# Patient Record
Sex: Female | Born: 1950
Health system: Southern US, Community
[De-identification: ages and names within clinical notes are randomized; demographics above are authoritative.]

## PROBLEM LIST (undated history)

## (undated) DIAGNOSIS — M797 Fibromyalgia: Secondary | ICD-10-CM

## (undated) DIAGNOSIS — M3320 Polymyositis, organ involvement unspecified: Secondary | ICD-10-CM

## (undated) DIAGNOSIS — E1165 Type 2 diabetes mellitus with hyperglycemia: Secondary | ICD-10-CM

## (undated) HISTORY — PX: SPINE SURGERY: SHX786

## (undated) HISTORY — DX: Type 2 diabetes mellitus with hyperglycemia: E11.65

## (undated) HISTORY — PX: KNEE ARTHROSCOPY: SUR90

---

## 1997-11-26 ENCOUNTER — Ambulatory Visit (HOSPITAL_COMMUNITY): Admission: RE | Admit: 1997-11-26 | Discharge: 1997-11-26 | Payer: Self-pay | Admitting: Obstetrics and Gynecology

## 1997-12-18 ENCOUNTER — Ambulatory Visit (HOSPITAL_COMMUNITY): Admission: RE | Admit: 1997-12-18 | Discharge: 1997-12-18 | Payer: Self-pay | Admitting: Neurological Surgery

## 1999-03-10 ENCOUNTER — Encounter: Payer: Self-pay | Admitting: Obstetrics and Gynecology

## 1999-03-10 ENCOUNTER — Ambulatory Visit (HOSPITAL_COMMUNITY): Admission: RE | Admit: 1999-03-10 | Discharge: 1999-03-10 | Payer: Self-pay | Admitting: Obstetrics and Gynecology

## 2000-04-10 ENCOUNTER — Ambulatory Visit (HOSPITAL_COMMUNITY): Admission: RE | Admit: 2000-04-10 | Discharge: 2000-04-10 | Payer: Self-pay | Admitting: Neurological Surgery

## 2000-04-10 ENCOUNTER — Encounter: Payer: Self-pay | Admitting: Neurological Surgery

## 2000-04-28 ENCOUNTER — Encounter: Payer: Self-pay | Admitting: Neurological Surgery

## 2000-05-02 ENCOUNTER — Inpatient Hospital Stay (HOSPITAL_COMMUNITY): Admission: RE | Admit: 2000-05-02 | Discharge: 2000-05-04 | Payer: Self-pay | Admitting: Neurological Surgery

## 2000-05-02 ENCOUNTER — Encounter: Payer: Self-pay | Admitting: Neurological Surgery

## 2000-05-27 ENCOUNTER — Encounter: Payer: Self-pay | Admitting: Neurological Surgery

## 2000-05-27 ENCOUNTER — Encounter: Admission: RE | Admit: 2000-05-27 | Discharge: 2000-05-27 | Payer: Self-pay | Admitting: Neurological Surgery

## 2000-07-20 ENCOUNTER — Encounter: Admission: RE | Admit: 2000-07-20 | Discharge: 2000-07-20 | Payer: Self-pay | Admitting: Neurological Surgery

## 2000-07-20 ENCOUNTER — Encounter: Payer: Self-pay | Admitting: Neurological Surgery

## 2000-09-13 ENCOUNTER — Ambulatory Visit (HOSPITAL_COMMUNITY): Admission: RE | Admit: 2000-09-13 | Discharge: 2000-09-13 | Payer: Self-pay | Admitting: Obstetrics and Gynecology

## 2000-09-13 ENCOUNTER — Encounter: Payer: Self-pay | Admitting: Obstetrics and Gynecology

## 2001-10-24 ENCOUNTER — Encounter: Payer: Self-pay | Admitting: Internal Medicine

## 2001-10-24 ENCOUNTER — Ambulatory Visit (HOSPITAL_COMMUNITY): Admission: RE | Admit: 2001-10-24 | Discharge: 2001-10-24 | Payer: Self-pay | Admitting: Internal Medicine

## 2003-08-21 ENCOUNTER — Ambulatory Visit (HOSPITAL_COMMUNITY): Admission: RE | Admit: 2003-08-21 | Discharge: 2003-08-21 | Payer: Self-pay | Admitting: Internal Medicine

## 2005-02-04 ENCOUNTER — Ambulatory Visit (HOSPITAL_COMMUNITY): Admission: RE | Admit: 2005-02-04 | Discharge: 2005-02-04 | Payer: Self-pay | Admitting: General Surgery

## 2005-02-04 ENCOUNTER — Encounter (INDEPENDENT_AMBULATORY_CARE_PROVIDER_SITE_OTHER): Payer: Self-pay | Admitting: *Deleted

## 2005-02-19 ENCOUNTER — Ambulatory Visit (HOSPITAL_COMMUNITY): Admission: RE | Admit: 2005-02-19 | Discharge: 2005-02-19 | Payer: Self-pay | Admitting: Internal Medicine

## 2007-03-28 ENCOUNTER — Ambulatory Visit (HOSPITAL_COMMUNITY): Admission: RE | Admit: 2007-03-28 | Discharge: 2007-03-28 | Payer: Self-pay | Admitting: Obstetrics and Gynecology

## 2008-04-16 ENCOUNTER — Ambulatory Visit (HOSPITAL_COMMUNITY): Admission: RE | Admit: 2008-04-16 | Discharge: 2008-04-16 | Payer: Self-pay | Admitting: Internal Medicine

## 2011-01-08 NOTE — Discharge Summary (Signed)
NAMETILIA, FASO                 ACCOUNT NO.:  [id number]   MEDICAL RECORD NO.:  [id number]          PATIENT TYPE:  OIB   LOCATION:  2899                         FACILITY:  [facility]   PHYSICIAN:  Gita Kudo, M.D. DATE OF BIRTH:  1951-08-01   DATE OF ADMISSION:  02/04/2005  DATE OF DISCHARGE:                                 DISCHARGE SUMMARY   PROCEDURE:  Right deltoid muscle biopsy.   SURGEON:  Gita Kudo, M.D.   ANESTHESIA:  General mask-1% Xylocaine/0.5% Marcaine.   PREOPERATIVE DIAGNOSES:  Possible myositis.   POSTOPERATIVE DIAGNOSES:  Possible myositis.   CLINICAL COURSE:  A 60 year old female with multiple musculoskeletal  symptoms and workup that reveals possible myositis. Muscle biopsy was  needed.   FINDINGS:  The deltoid muscle looked normal.   DESCRIPTION OF PROCEDURE:  Under satisfactory general anesthesia, the  patient was positioned, prepped and draped in a standard fashion. A vertical  incision made over the upper right deltoid and carried down to the fascia.  Cautery was not used to avoid any artifact. No Marcaine or lidocaine was  infiltrated at this time. The fascia opened and three strips of muscle  obtained, secured with suture to the tongue blade and sent in moistened  gauze per the new protocol. Pathology was notified and they called [place] that  they received the specimen. It will be sent to [facility].   The wound was then infiltrated with Marcaine, lavaged with saline and closed  in layers with 3-0 Vicryl and 4-0 nylon. Intermittent Steri-Strips applied.  There were no complications. A sterile absorbent dressing was applied. Went  to the recovery room from the operating room in good condition.       MRL/MEDQ  D:  02/04/2005  T:  02/04/2005  Job:  [id number]   cc:   Candyce Churn, M.D.  [address] [place]  [place] [address]  Fax: [phone]   Demetria Pore. Coral Spikes, M.D.  [address]  [address]  [place]  [place] [address]  Fax: [phone]   C. Lesia Sago, M.D.  [address]  [address]  [place]  [place] [address]  Fax: [phone]

## 2011-01-08 NOTE — Op Note (Signed)
[place]  Patient:    Dawn Sanchez, Dawn Sanchez                          MRN: [id number] Proc. Date: 05/02/00 Adm. Date:  [id number] Attending:  Jonne Ply                           Operative Report  PREOPERATIVE DIAGNOSIS:  Cervical spondylosis, C3-4, C4-5 with cervical radiculopathy C5.  POSTOPERATIVE DIAGNOSIS:  Cervical spondylosis, C3-4, C4-5 with cervical radiculopathy C5.  OPERATION PERFORMED:  Anterior cervical diskectomy and arthrodesis C3-4, C4-5.  SURGEON:  Stefani Dama, M.D.  ASSISTANT:  Payton Doughty, M.D.  ANESTHESIA:  General endotracheal.  INDICATIONS FOR PROCEDURE:  The patient is a 60 year old individual who has had significant neck, shoulder and arm pain.  She has discomfort in the distribution of C5 nerve roots, mild deltoid weakness.  She was advised regarding surgical decompression and stabilization of the procedure.  DESCRIPTION OF PROCEDURE:  The patient was brought to the operating room and placed on the table in supine position.  After smooth induction of general endotracheal anesthesia, she was placed in 5 pounds of halter traction.  The neck was shaved, prepped with DuraPrep and draped in sterile fashion.  A transverse incision was made in the upper portion of the neck on the left side and carried down to the platysma.  The plane between the sternocleidomastoid and the strap muscles was dissected bluntly until the prevertebral space was reached.  The first identifiable disk space was noted to be that of C3-4. Self-retaining retractors of the Caspar type were placed into the wound behind the longus colli muscle which was stripped on either side of the midline.  The dissection was then continued opening up the disk space and removing large ventral osteophyte from C3-4. Total diskectomy was performed using a combination of curets and rongeurs with loop magnification and headlight.  The dissection was then carried out  to the right side where a 1 and 2 mm Kerrison punch along with the Midas Rex drill and A-2 bur was used to remove uncinate process hypertrophy and a large lateral bone spur.  Same procedure was carried out on the left side.  In the end the common dural tube and the take off of the C4 nerve roots were identified and cleared bilaterally.  7 mm round fibular graft was placed into the interspace after being packed with the patients autologous bone. Attention was turned to C4-5 where a similar procedure was carried out.  The disk space was opened and the nerve roots were well decompressed on either side at C4-5.  With this being accomplished, a 7 mm round fibular graft was also placed.  Hemostasis from the epidural veins was checked with bipolar cautery and small pledgets of Gelfoam soaked in thrombin which were later removed.  Traction was then removed after placing both grafts and the patients neck was placed in slight flexion.  A 37 mm Synthes plate was then affixed with six locking 4 x 14 mm screws, which were self drilling and self tapping.  Locking screws were than placed and a localizing radiograph identified good position of the hardware and end plates. The area was checked for hemostasis in the soft tissues and the platysma was closed with 3-0 Vicryl in interrupted fashion.  3-0 Vicryl was used subcuticularly.  A clear plastic dressing was placed on  the skin.  The patient tolerated the procedure well and was returned to the recovery room in stable condition. DD:  05/02/00 TD:  05/03/00 Job: [id number]

## 2015-01-09 ENCOUNTER — Non-Acute Institutional Stay (SKILLED_NURSING_FACILITY): Payer: Medicare Other | Admitting: Internal Medicine

## 2015-01-09 DIAGNOSIS — E1165 Type 2 diabetes mellitus with hyperglycemia: Secondary | ICD-10-CM

## 2015-01-09 DIAGNOSIS — E43 Unspecified severe protein-calorie malnutrition: Secondary | ICD-10-CM

## 2015-01-09 DIAGNOSIS — R5381 Other malaise: Secondary | ICD-10-CM | POA: Diagnosis not present

## 2015-01-09 DIAGNOSIS — Z794 Long term (current) use of insulin: Secondary | ICD-10-CM | POA: Diagnosis not present

## 2015-01-09 DIAGNOSIS — R1013 Epigastric pain: Secondary | ICD-10-CM | POA: Diagnosis not present

## 2015-01-09 DIAGNOSIS — R338 Other retention of urine: Secondary | ICD-10-CM

## 2015-05-31 ENCOUNTER — Encounter: Payer: Self-pay | Admitting: Internal Medicine

## 2015-05-31 DIAGNOSIS — R5381 Other malaise: Secondary | ICD-10-CM | POA: Insufficient documentation

## 2015-05-31 DIAGNOSIS — E1165 Type 2 diabetes mellitus with hyperglycemia: Secondary | ICD-10-CM

## 2015-05-31 DIAGNOSIS — E43 Unspecified severe protein-calorie malnutrition: Secondary | ICD-10-CM | POA: Insufficient documentation

## 2015-05-31 DIAGNOSIS — N179 Acute kidney failure, unspecified: Secondary | ICD-10-CM | POA: Insufficient documentation

## 2015-05-31 DIAGNOSIS — A419 Sepsis, unspecified organism: Secondary | ICD-10-CM | POA: Insufficient documentation

## 2015-05-31 DIAGNOSIS — R1013 Epigastric pain: Secondary | ICD-10-CM | POA: Insufficient documentation

## 2015-05-31 DIAGNOSIS — E1110 Type 2 diabetes mellitus with ketoacidosis without coma: Secondary | ICD-10-CM | POA: Insufficient documentation

## 2015-05-31 DIAGNOSIS — R338 Other retention of urine: Secondary | ICD-10-CM | POA: Insufficient documentation

## 2015-05-31 DIAGNOSIS — IMO0002 Reserved for concepts with insufficient information to code with codable children: Secondary | ICD-10-CM

## 2015-05-31 HISTORY — DX: Reserved for concepts with insufficient information to code with codable children: IMO0002

## 2015-05-31 HISTORY — DX: Type 2 diabetes mellitus with hyperglycemia: E11.65

## 2015-05-31 NOTE — Progress Notes (Signed)
Patient ID: Dawn Sanchez, female   DOB: June 22, 1951, 64 y.o.   MRN: [id number]    HISTORY AND PHYSICAL   DATE: 01/09/15  Location:  [facility]    Place of Service: SNF [phone])   Extended Emergency Contact Information Primary Emergency Contact: [facility] Address: [address]          [place],  [address] Home Phone: [phone] Relation: None  Advanced Directive information  DNR  Chief Complaint  Patient presents with  . New Admit To SNF    HPI:  64 yo female seen today as a new admission into SNF following hospital stay for DKA, sepsis, urinary retention requiring foley cath, sever protein calorie malnutrition, deconditioning. She had AG >46 with A1c 9.2%. She was treated with insulin gtt and given IVF. CBGs gradually improved. She had urinary retention and was seen by urology. Foley cath necessary at d/c.   Dyspepsia - stable on pepcid. She has intermittent diarrhea and takes prn loperamide.  She lives with her elderly father  PMH : DM 2 uncontrolled - CBG 316 in SNF and ranges 150-300, occasionally >400  Past Surgical History  Procedure Laterality Date  . Spine surgery      back and neck  . Knee arthroscopy      No care team member to display  Social History   Social History  . Marital Status: Single    Spouse Name: N/A  . Number of Children: N/A  . Years of Education: N/A   Occupational History  . Not on file.   Social History Main Topics  . Smoking status: Never Smoker   . Smokeless tobacco: Not on file  . Alcohol Use: Not on file  . Drug Use: Not on file  . Sexual Activity: Not on file   Other Topics Concern  . Not on file   Social History Narrative  . No narrative on file     reports that she has never smoked. She does not have any smokeless tobacco history on file. Her alcohol and drug histories are not on file.  Family History  Problem Relation Age of Onset  . Diabetes Mother    Family Status  Relation Status  Death Age  . Mother Deceased   . Father Alive      There is no immunization history on file for this patient.  Allergies  Allergen Reactions  . Anesthetics, Ester   . Ciprofloxacin Hcl   . Other     Pain medications  . Penicillins   . Sulfa Antibiotics     Medications: Patient's Medications  New Prescriptions   No medications on file  Previous Medications   FAMOTIDINE (PEPCID) 20 MG TABLET    Take 20 mg by mouth 2 (two) times daily.   INSULIN GLARGINE (LANTUS) 100 UNIT/ML INJECTION    Inject 10 Units into the skin at bedtime.   INSULIN LISPRO PROTAMINE-LISPRO (HUMALOG 50/50 MIX) (50-50) 100 UNIT/ML SUSP INJECTION    Inject 5 Units into the skin 3 (three) times daily before meals. If CBG >150   LOPERAMIDE (IMODIUM) 2 MG CAPSULE    Take 2 mg by mouth every 8 (eight) hours as needed for diarrhea or loose stools.  Modified Medications   No medications on file  Discontinued Medications   No medications on file    Review of Systems  Constitutional: Negative for fever, chills, diaphoresis, activity change, appetite change and fatigue.  HENT: Negative for ear pain and sore throat.  Eyes: Negative for visual disturbance.  Respiratory: Negative for cough, chest tightness and shortness of breath.   Cardiovascular: Negative for chest pain, palpitations and leg swelling.  Gastrointestinal: Negative for nausea, vomiting, abdominal pain, diarrhea, constipation and blood in stool.  Genitourinary: Negative for dysuria.  Musculoskeletal: Negative for arthralgias.  Neurological: Negative for dizziness, tremors, numbness and headaches.  Psychiatric/Behavioral: Negative for sleep disturbance. The patient is not nervous/anxious.     Filed Vitals:   01/10/15 1515  BP: 97/60  Pulse: 101  Temp: 97.9 F (36.6 C)  Weight: 82 lb (37.195 kg)  SpO2: 97%   There is no height on file to calculate BMI.  Physical Exam  Constitutional: She is oriented to person, place, and time. No distress.    Wasting/atrophy of muscles noted. Frail appearing in NAD  HENT:  Mouth/Throat: Oropharynx is clear and moist. Mucous membranes are dry. No oropharyngeal exudate.  Eyes: Pupils are equal, round, and reactive to light. No scleral icterus.  Neck: Neck supple. Carotid bruit is not present. No tracheal deviation present. No thyromegaly present.  Cardiovascular: Normal rate, regular rhythm, normal heart sounds and intact distal pulses.  Exam reveals no gallop and no friction rub.   No murmur heard. No LE edema b/l. no calf TTP.   Pulmonary/Chest: Effort normal and breath sounds normal. No stridor. No respiratory distress. She has no wheezes. She has no rales.  Abdominal: Soft. Bowel sounds are normal. She exhibits no distension and no mass. There is no hepatomegaly. There is no tenderness. There is no rebound and no guarding.  Genitourinary:  Foley cath DTG  Lymphadenopathy:    She has no cervical adenopathy.  Neurological: She is alert and oriented to person, place, and time.  Skin: Skin is warm and dry. No rash noted.  Psychiatric: She has a normal mood and affect. Her behavior is normal. Judgment and thought content normal.     Labs reviewed:  BS on admission >700 with gap 46  A1c 9.2%  No results found.   Assessment/Plan   ICD-9-CM ICD-10-CM   1. Uncontrolled type 2 diabetes mellitus with hyperglycemia, with long-term current use of insulin (HCC) s/p DKA 250.02 E11.65    V58.67 Z79.4   2. Acute urinary retention - now with foley cath 788.29 R33.8   3. Physical deconditioning 799.3 R53.81   4. Severe protein-calorie malnutrition (HCC) 262 E43   5. Dyspepsia - stable 536.8 R10.13     --f/u with urology Dr Merry Lofty for urinary retention  --may benefit from nutritional supplemt  --cont current meds as ordered  --CBGs qAC and qHS  --PT/OT/ST as ordered  --GOAL: short term rehab and d/c home when medically appropriate. Communicated with pt and nursing.  --will  follow  Amor Hyle S. Ancil Linsey  [facility] and [facility] [address] [place], [place] [address] [phone] Cell (Monday-Friday 8 AM - 5 PM) [phone] After 5 PM and follow prompts

## 2016-05-20 ENCOUNTER — Other Ambulatory Visit: Payer: Self-pay | Admitting: Internal Medicine

## 2016-05-20 DIAGNOSIS — Z1231 Encounter for screening mammogram for malignant neoplasm of breast: Secondary | ICD-10-CM

## 2016-05-28 ENCOUNTER — Ambulatory Visit
Admission: RE | Admit: 2016-05-28 | Discharge: 2016-05-28 | Disposition: A | Discharge status: Home / Self Care | Payer: Medicare Other | Source: Ambulatory Visit | Attending: Internal Medicine | Admitting: Internal Medicine

## 2016-05-28 ENCOUNTER — Encounter: Payer: Self-pay | Admitting: Radiology

## 2016-05-28 DIAGNOSIS — Z1231 Encounter for screening mammogram for malignant neoplasm of breast: Secondary | ICD-10-CM

## 2016-10-14 ENCOUNTER — Encounter (HOSPITAL_BASED_OUTPATIENT_CLINIC_OR_DEPARTMENT_OTHER): Payer: Self-pay | Admitting: *Deleted

## 2016-10-14 ENCOUNTER — Emergency Department (HOSPITAL_BASED_OUTPATIENT_CLINIC_OR_DEPARTMENT_OTHER)
Admission: EM | Admit: 2016-10-14 | Discharge: 2016-10-14 | Disposition: A | Discharge status: Other Acute Inpatient Hospital | Payer: Medicare Other | Attending: Emergency Medicine | Admitting: Emergency Medicine

## 2016-10-14 DIAGNOSIS — R739 Hyperglycemia, unspecified: Secondary | ICD-10-CM

## 2016-10-14 DIAGNOSIS — L03211 Cellulitis of face: Secondary | ICD-10-CM

## 2016-10-14 DIAGNOSIS — E1165 Type 2 diabetes mellitus with hyperglycemia: Secondary | ICD-10-CM | POA: Diagnosis not present

## 2016-10-14 DIAGNOSIS — Z794 Long term (current) use of insulin: Secondary | ICD-10-CM | POA: Diagnosis not present

## 2016-10-14 DIAGNOSIS — K130 Diseases of lips: Secondary | ICD-10-CM | POA: Insufficient documentation

## 2016-10-14 DIAGNOSIS — R220 Localized swelling, mass and lump, head: Secondary | ICD-10-CM | POA: Diagnosis present

## 2016-10-14 HISTORY — DX: Polymyositis, organ involvement unspecified: M33.20

## 2016-10-14 HISTORY — DX: Fibromyalgia: M79.7

## 2016-10-14 LAB — I-STAT CG4 LACTIC ACID, ED
LACTIC ACID, VENOUS: 1.54 mmol/L (ref 0.5–1.9)
Lactic Acid, Venous: 2.38 mmol/L (ref 0.5–1.9)

## 2016-10-14 LAB — COMPREHENSIVE METABOLIC PANEL
ALBUMIN: 3.7 g/dL (ref 3.5–5.0)
ALK PHOS: 96 U/L (ref 38–126)
ALT: 29 U/L (ref 14–54)
ANION GAP: 14 (ref 5–15)
AST: 22 U/L (ref 15–41)
BUN: 21 mg/dL — ABNORMAL HIGH (ref 6–20)
CALCIUM: 9 mg/dL (ref 8.9–10.3)
CHLORIDE: 96 mmol/L — AB (ref 101–111)
CO2: 23 mmol/L (ref 22–32)
Creatinine, Ser: 1.05 mg/dL — ABNORMAL HIGH (ref 0.44–1.00)
GFR calc Af Amer: >60 mL/min (ref >60)
GFR calc non Af Amer: 54 mL/min — ABNORMAL LOW (ref >60)
GLUCOSE: 328 mg/dL — AB (ref 65–99)
POTASSIUM: 4.6 mmol/L (ref 3.5–5.1)
SODIUM: 133 mmol/L — AB (ref 135–145)
Total Bilirubin: 1 mg/dL (ref 0.3–1.2)
Total Protein: 7.7 g/dL (ref 6.5–8.1)

## 2016-10-14 LAB — URINALYSIS, ROUTINE W REFLEX MICROSCOPIC
GLUCOSE, UA: 250 mg/dL — AB
Leukocytes, UA: NEGATIVE
Nitrite: NEGATIVE
PH: 6 (ref 5.0–8.0)
Protein, ur: 100 mg/dL — AB
SPECIFIC GRAVITY, URINE: 1.026 (ref 1.005–1.030)

## 2016-10-14 LAB — CBC WITH DIFFERENTIAL/PLATELET
Basophils Absolute: 0 10*3/uL (ref 0.0–0.1)
Basophils Relative: 0 %
Eosinophils Absolute: 0 10*3/uL (ref 0.0–0.7)
Eosinophils Relative: 0 %
HEMATOCRIT: 35.7 % — AB (ref 36.0–46.0)
Hemoglobin: 11.2 g/dL — ABNORMAL LOW (ref 12.0–15.0)
LYMPHS ABS: 1.6 10*3/uL (ref 0.7–4.0)
LYMPHS PCT: 9 %
MCH: 28.6 pg (ref 26.0–34.0)
MCHC: 31.4 g/dL (ref 30.0–36.0)
MCV: 91.3 fL (ref 78.0–100.0)
MONO ABS: 1.5 10*3/uL — AB (ref 0.1–1.0)
MONOS PCT: 9 %
NEUTROS ABS: 14.7 10*3/uL — AB (ref 1.7–7.7)
Neutrophils Relative %: 82 %
Platelets: 328 10*3/uL (ref 150–400)
RBC: 3.91 MIL/uL (ref 3.87–5.11)
RDW: 13 % (ref 11.5–15.5)
WBC: 17.9 10*3/uL — ABNORMAL HIGH (ref 4.0–10.5)

## 2016-10-14 LAB — URINALYSIS, MICROSCOPIC (REFLEX)

## 2016-10-14 MED ORDER — ONDANSETRON HCL 4 MG/2ML IJ SOLN
4.0000 mg | Freq: Once | INTRAMUSCULAR | Status: AC
  Administered 2016-10-14: 4 mg via INTRAVENOUS
  Filled 2016-10-14: qty 2

## 2016-10-14 MED ORDER — BUPIVACAINE-EPINEPHRINE (PF) 0.5% -1:200000 IJ SOLN
1.8000 mL | Freq: Once | INTRAMUSCULAR | Status: AC
  Administered 2016-10-14: 1.8 mL
  Filled 2016-10-14: qty 1.8

## 2016-10-14 MED ORDER — METRONIDAZOLE 500 MG PO TABS
500.0000 mg | ORAL_TABLET | Freq: Three times a day (TID) | ORAL | Status: DC
  Administered 2016-10-14: 500 mg via ORAL
  Filled 2016-10-14: qty 1

## 2016-10-14 MED ORDER — SODIUM CHLORIDE 0.9 % IV BOLUS (SEPSIS)
1000.0000 mL | Freq: Once | INTRAVENOUS | Status: AC
  Administered 2016-10-14: 1000 mL via INTRAVENOUS

## 2016-10-14 MED ORDER — VANCOMYCIN HCL IN DEXTROSE 1-5 GM/200ML-% IV SOLN: 1000.0000 mg | Freq: Once | INTRAVENOUS | Status: DC

## 2016-10-14 MED ORDER — DEXTROSE 5 % IV SOLN
2.0000 g | Freq: Once | INTRAVENOUS | Status: AC
  Administered 2016-10-14: 2 g via INTRAVENOUS
  Filled 2016-10-14: qty 2

## 2016-10-14 MED ORDER — HYDROMORPHONE HCL 1 MG/ML IJ SOLN
0.5000 mg | Freq: Once | INTRAMUSCULAR | Status: AC
  Administered 2016-10-14: 0.5 mg via INTRAVENOUS
  Filled 2016-10-14: qty 1

## 2016-10-14 MED ORDER — AZTREONAM 1 G IJ SOLR
INTRAMUSCULAR | Status: AC
  Filled 2016-10-14: qty 2

## 2016-10-14 NOTE — ED Provider Notes (Signed)
[facility] Provider Note   CSN: [id number] Arrival date & time: 10/14/16  7829     History   Chief Complaint Chief Complaint  Patient presents with  . Oral Swelling    HPI Dawn Sanchez is a 66 y.o. female with a past medical history of insulin-dependent diabetes mellitus, and polymyositis. Patient is on methotrexate. She presents the emergency department with chief complaint of lip infection. Patient states that Monday she had a small bump under her lip that she might be a cold sore. She has no previous history of cold sores. Patient states she put some bacitracin on it. The next day her lips swelled up significantly. She states she stayed in bed all day. Today she comes in with severe swelling that is now swelling into her. She has red streaking down the chin. She's never had anything like this before. She denies any injury. She denies dental infections. Patient states that she been taking her insulin correctly. She denies fevers, chills or myalgias. HPI  Past Medical History:  Diagnosis Date  . DM (diabetes mellitus), type 2, uncontrolled (HCC) 05/31/2015  . Fibromyalgia   . Polymyositis [facility])     Patient Active Problem List   Diagnosis Date Noted  . DM (diabetes mellitus) type 2, uncontrolled, with ketoacidosis (HCC) 05/31/2015  . Sepsis (HCC) 05/31/2015  . AKI (acute kidney injury) (HCC) 05/31/2015  . Acute urinary retention 05/31/2015  . Severe protein-calorie malnutrition (HCC) 05/31/2015  . Dyspepsia 05/31/2015  . Physical deconditioning 05/31/2015  . DM (diabetes mellitus), type 2, uncontrolled (HCC) 05/31/2015    Past Surgical History:  Procedure Laterality Date  . KNEE ARTHROSCOPY    . SPINE SURGERY     back and neck    OB History    No data available       Home Medications    Prior to Admission medications   Medication Sig Start Date End Date Taking? Authorizing Provider  apixaban (ELIQUIS) 2.5 MG TABS tablet Take 2.5 mg by mouth 2 (two)  times daily.   Yes Historical Provider, MD  insulin aspart (NOVOLOG) 100 UNIT/ML injection Inject into the skin 3 (three) times daily before meals. Sliding Scale   Yes Historical Provider, MD  famotidine (PEPCID) 20 MG tablet Take 20 mg by mouth 2 (two) times daily.    Historical Provider, MD  insulin glargine (LANTUS) 100 UNIT/ML injection Inject 30 Units into the skin at bedtime.     Historical Provider, MD  insulin lispro protamine-lispro (HUMALOG 50/50 MIX) (50-50) 100 UNIT/ML SUSP injection Inject 5 Units into the skin 3 (three) times daily before meals. If CBG >150    Historical Provider, MD  loperamide (IMODIUM) 2 MG capsule Take 2 mg by mouth every 8 (eight) hours as needed for diarrhea or loose stools.    Historical Provider, MD    Family History Family History  Problem Relation Age of Onset  . Diabetes Mother     Social History Social History  Substance Use Topics  . Smoking status: Never Smoker  . Smokeless tobacco: Never Used  . Alcohol use No     Allergies   Anesthetics, ester; Ciprofloxacin hcl; Other; Penicillins; and Sulfa antibiotics   Review of Systems Review of Systems Ten systems reviewed and are negative for acute change, except as noted in the HPI.   Physical Exam Updated Vital Signs BP 132/81   Pulse 99   Temp 98.9 F (37.2 C) (Oral)   Resp 15   Ht 5'  5" (1.651 m)   Wt 61.7 kg   SpO2 100%   BMI 22.63 kg/m   Physical Exam  Constitutional: She is oriented to person, place, and time. She appears well-developed and well-nourished. No distress.  HENT:  Head: Normocephalic and atraumatic.  Right lower lip indurated and swollen with crusting and desquamation. It is firm and tender to palpation. There is some swelling on the buccal mucosa and gingiva. No lingular or sublingual or swelling, oropharynx is clear and moist. There is erythematous streaking toward the submental region and laterally towards the right cheek  Eyes: Conjunctivae and EOM are  normal. Pupils are equal, round, and reactive to light. No scleral icterus.  Neck: Normal range of motion.  Cardiovascular: Normal rate, regular rhythm and normal heart sounds.  Exam reveals no gallop and no friction rub.   No murmur heard. Pulmonary/Chest: Effort normal and breath sounds normal. No respiratory distress.  Abdominal: Soft. Bowel sounds are normal. She exhibits no distension and no mass. There is no tenderness. There is no guarding.  Neurological: She is alert and oriented to person, place, and time.  Skin: Skin is warm and dry. She is not diaphoretic.  Nursing note and vitals reviewed.    ED Treatments / Results  Labs (all labs ordered are listed, but only abnormal results are displayed) Labs Reviewed  COMPREHENSIVE METABOLIC PANEL - Abnormal; Notable for the following:       Result Value   Sodium 133 (*)    Chloride 96 (*)    Glucose, Bld 328 (*)    BUN 21 (*)    Creatinine, Ser 1.05 (*)    GFR calc non Af Amer 54 (*)    All other components within normal limits  CBC WITH DIFFERENTIAL/PLATELET - Abnormal; Notable for the following:    WBC 17.9 (*)    Hemoglobin 11.2 (*)    HCT 35.7 (*)    Neutro Abs 14.7 (*)    Monocytes Absolute 1.5 (*)    All other components within normal limits  I-STAT CG4 LACTIC ACID, ED - Abnormal; Notable for the following:    Lactic Acid, Venous 2.38 (*)    All other components within normal limits  CULTURE, BLOOD (ROUTINE X 2)  CULTURE, BLOOD (ROUTINE X 2)  AEROBIC CULTURE (SUPERFICIAL SPECIMEN)  URINALYSIS, ROUTINE W REFLEX MICROSCOPIC    EKG  EKG Interpretation  Date/Time:  Thursday October 14 2016 11:05:08 EST Ventricular Rate:  105 PR Interval:    QRS Duration: 78 QT Interval:  365 QTC Calculation: 483 R Axis:   52 Text Interpretation:  Sinus tachycardia Low voltage, precordial leads Baseline wander in lead(s) V1 V3 V4 V5 No significant change since last tracing Confirmed by Anitra LauthPLUNKETT  MD, Alphonzo LemmingsWHITNEY ([id number]) on 10/14/2016  11:20:45 AM       Radiology No results found.  Procedures .Marland Kitchen.Incision and Drainage Date/Time: 10/14/2016 12:29 PM Performed by: Arthor CaptainHARRIS, Alexiah Koroma Authorized by: Arthor CaptainHARRIS, Linnae Rasool   Consent:    Consent obtained:  Verbal   Consent given by:  Patient   Risks discussed:  Bleeding, incomplete drainage, pain and infection   Alternatives discussed:  No treatment Location:    Type:  Abscess   Location:  Mouth   Mouth location: lower lip. Pre-procedure details:    Skin preparation:  Betadine Anesthesia (see MAR for exact dosages):    Anesthesia method:  Local infiltration   Local anesthetic:  Bupivacaine 0.5% WITH epi Procedure type:    Complexity:  Complex Procedure details:  Incision types:  Cruciate   Incision depth:  Dermal   Scalpel blade:  11   Wound management:  Probed and deloculated and irrigated with saline   Drainage:  Purulent   Drainage amount:  Scant   Wound treatment:  Wound left open Post-procedure details:    Patient tolerance of procedure:  Tolerated well, no immediate complications   (including critical care time)  Medications Ordered in ED Medications  metroNIDAZOLE (FLAGYL) tablet 500 mg (500 mg Oral Given 10/14/16 1143)  aztreonam (AZACTAM) 1 g injection (not administered)  sodium chloride 0.9 % bolus 1,000 mL (1,000 mLs Intravenous New Bag/Given 10/14/16 1118)    And  sodium chloride 0.9 % bolus 1,000 mL (1,000 mLs Intravenous New Bag/Given 10/14/16 1116)  aztreonam (AZACTAM) 2 g in dextrose 5 % 50 mL IVPB (2 g Intravenous New Bag/Given 10/14/16 1133)  bupivacaine-epinephrine (MARCAINE W/ EPI) 0.5% -1:200000 injection 1.8 mL (1.8 mLs Infiltration Given 10/14/16 1143)  bupivacaine-epinephrine (MARCAINE W/ EPI) 0.5% -1:200000 injection 1.8 mL (1.8 mLs Infiltration Given 10/14/16 1143)     Initial Impression / Assessment and Plan / ED Course  I have reviewed the triage vital signs and the nursing notes.  Pertinent labs & imaging results that were available  during my care of the patient were reviewed by me and considered in my medical decision making (see chart for details).  Clinical Course as of Oct 14 1217  Thu Oct 14, 2016  1213 Lactic Acid, Venous: (!!) 2.38 [AH]  1213 Sodium: (!) 133 [AH]  1214 WBC: (!) 17.9 [AH]  1214 Glucose: (!) 328 [AH]  1214 Patient with cellulitis and abscess of the lip. Successfully incised and drained with a minimal amount of discharge from the lip. This will be sent for culture. Patient does qualify for sepsis criteria. Initially she was hypertensive but easily rebound with just some minimal amount of fluids. Patient is vancomycin allergic. I did discuss the antibiotic regimen with [facility] emergency department pharmacist. She did suggest we [facility] nasal lid for coverage given her allergies, however, this antibiotic is unavailable at a freestanding ER. I will discuss this with the physicians at [facility] where the patient wishes to be transferred. NEUT#: (!) 14.7 [AH]    Clinical Course User Index [AH] Arthor Captain, PA-C    Patient with hyperglycemia, she fits the sepsis criteria. Cellulitis of the lip and face. Incision and drainage performed and cultures have been sent. Patient has multiple drug allergies. She is anaphylactic to penicillin. States that she coated with IV vancomycin. She was able to tolerate oral vancomycin She has a history of C. difficile colitis, making clindamycin a poor choice of antibiotic regimen. I discussed these with the pharmacist, who recommends Linezolid . However, we do not have this available. Therefore, the patient is not having any good coverage for MRSA at this time and I will discuss it with the admitting physician. Patient admitted to [facility] by Dr. Wilmon Arms. She is stable throughout her visit. Appears safe for transfer.   Final Clinical Impressions(s) / ED Diagnoses   Final diagnoses:  Lip abscess  Facial cellulitis  Hyperglycemia     New Prescriptions New Prescriptions   No medications on file     Arthor Captain, PA-C 10/14/16 1650    Gwyneth Sprout, MD 10/15/16 [phone]

## 2016-10-14 NOTE — ED Triage Notes (Signed)
Pt reports thinking she was getting a cold sore 2 days ago (no history of cold sores). Today lower lip is markedly swollen and red with erythema on chin

## 2016-10-14 NOTE — ED Notes (Signed)
Attempted to call report x 1  

## 2016-10-15 ENCOUNTER — Telehealth: Payer: Self-pay | Admitting: Internal Medicine

## 2016-10-15 ENCOUNTER — Telehealth (HOSPITAL_BASED_OUTPATIENT_CLINIC_OR_DEPARTMENT_OTHER): Payer: Self-pay | Admitting: Emergency Medicine

## 2016-10-15 LAB — BLOOD CULTURE ID PANEL (REFLEXED)
Acinetobacter baumannii: NOT DETECTED
CANDIDA ALBICANS: NOT DETECTED
CANDIDA GLABRATA: NOT DETECTED
CANDIDA PARAPSILOSIS: NOT DETECTED
CANDIDA TROPICALIS: NOT DETECTED
Candida krusei: NOT DETECTED
ENTEROBACTER CLOACAE COMPLEX: NOT DETECTED
ENTEROBACTERIACEAE SPECIES: NOT DETECTED
Enterococcus species: NOT DETECTED
Escherichia coli: NOT DETECTED
HAEMOPHILUS INFLUENZAE: NOT DETECTED
KLEBSIELLA OXYTOCA: NOT DETECTED
KLEBSIELLA PNEUMONIAE: NOT DETECTED
Listeria monocytogenes: NOT DETECTED
Methicillin resistance: NOT DETECTED
Neisseria meningitidis: NOT DETECTED
PROTEUS SPECIES: NOT DETECTED
Pseudomonas aeruginosa: NOT DETECTED
STREPTOCOCCUS PYOGENES: NOT DETECTED
Serratia marcescens: NOT DETECTED
Staphylococcus aureus (BCID): DETECTED — AB
Staphylococcus species: DETECTED — AB
Streptococcus agalactiae: NOT DETECTED
Streptococcus pneumoniae: NOT DETECTED
Streptococcus species: NOT DETECTED

## 2016-10-15 NOTE — Telephone Encounter (Signed)
Spoke to nurse caring for patient to relay information that the patient has MSSA bacteremia likely secondary to facial cellulitis.she was admitted [facility]

## 2016-10-16 LAB — AEROBIC CULTURE W GRAM STAIN (SUPERFICIAL SPECIMEN)

## 2016-10-16 LAB — AEROBIC CULTURE  (SUPERFICIAL SPECIMEN)

## 2016-10-17 ENCOUNTER — Telehealth: Payer: Self-pay

## 2016-10-17 LAB — CULTURE, BLOOD (ROUTINE X 2)

## 2016-10-17 NOTE — Telephone Encounter (Signed)
[facility]

## 2016-10-19 LAB — CULTURE, BLOOD (ROUTINE X 2): CULTURE: NO GROWTH

## 2019-05-29 ENCOUNTER — Other Ambulatory Visit: Payer: Self-pay | Admitting: Internal Medicine

## 2019-05-29 DIAGNOSIS — Z1231 Encounter for screening mammogram for malignant neoplasm of breast: Secondary | ICD-10-CM

## 2019-07-16 ENCOUNTER — Other Ambulatory Visit: Payer: Self-pay

## 2019-07-16 ENCOUNTER — Ambulatory Visit
Admission: RE | Admit: 2019-07-16 | Discharge: 2019-07-16 | Disposition: A | Discharge status: Home / Self Care | Payer: Medicare Other | Source: Ambulatory Visit | Attending: Internal Medicine | Admitting: Internal Medicine

## 2019-07-16 DIAGNOSIS — Z1231 Encounter for screening mammogram for malignant neoplasm of breast: Secondary | ICD-10-CM

## 2019-12-13 ENCOUNTER — Other Ambulatory Visit: Payer: Self-pay

## 2019-12-13 ENCOUNTER — Encounter (INDEPENDENT_AMBULATORY_CARE_PROVIDER_SITE_OTHER): Payer: Self-pay | Admitting: Ophthalmology

## 2019-12-13 ENCOUNTER — Ambulatory Visit (INDEPENDENT_AMBULATORY_CARE_PROVIDER_SITE_OTHER): Payer: Medicare Other | Admitting: Ophthalmology

## 2019-12-13 DIAGNOSIS — E113511 Type 2 diabetes mellitus with proliferative diabetic retinopathy with macular edema, right eye: Secondary | ICD-10-CM | POA: Insufficient documentation

## 2019-12-13 DIAGNOSIS — E113512 Type 2 diabetes mellitus with proliferative diabetic retinopathy with macular edema, left eye: Secondary | ICD-10-CM

## 2019-12-13 DIAGNOSIS — M3320 Polymyositis, organ involvement unspecified: Secondary | ICD-10-CM | POA: Insufficient documentation

## 2019-12-13 NOTE — Progress Notes (Signed)
12/13/2019     CHIEF COMPLAINT Patient presents for Diabetic Retinopathy without Macular Edema and Retina Follow Up   HISTORY OF PRESENT ILLNESS: Dawn Sanchez is a 68 y.o. female who presents to the clinic today for:   HPI    Retina Follow Up    Patient presents with  Diabetic Retinopathy.  In left eye.  This started 3 weeks ago.  Severity is mild.  Duration of 3 weeks.  Since onset it is stable.          Comments    3 Week focal laser OS  Pt denies noticeable changes to [place] OU since last visit. Pt denies ocular pain, flashes of light, or floaters OU. Pt c/o new bump on eyelid OD. No other new symptoms reported.   LBS: 72 this AM        Last edited by Hurman Horn, MD on 12/13/2019 10:03 AM. (History)      Referring physician: Josetta Huddle, MD [address],  [place] [address]  HISTORICAL INFORMATION:   Selected notes from the [facility]: No current outpatient medications on file. (Ophthalmic Drugs)   No current facility-administered medications for this visit. (Ophthalmic Drugs)   Current Outpatient Medications (Other)  Medication Sig  . apixaban (ELIQUIS) 2.5 MG TABS tablet Take 2.5 mg by mouth 2 (two) times daily.  . famotidine (PEPCID) 20 MG tablet Take 20 mg by mouth 2 (two) times daily.  . insulin aspart (NOVOLOG) 100 UNIT/ML injection Inject into the skin 3 (three) times daily before meals. Sliding Scale  . insulin glargine (LANTUS) 100 UNIT/ML injection Inject 30 Units into the skin at bedtime.   . insulin lispro protamine-lispro (HUMALOG 50/50 MIX) (50-50) 100 UNIT/ML SUSP injection Inject 5 Units into the skin 3 (three) times daily before meals. If CBG >150  . loperamide (IMODIUM) 2 MG capsule Take 2 mg by mouth every 8 (eight) hours as needed for diarrhea or loose stools.   No current facility-administered medications for this visit. (Other)      REVIEW OF  SYSTEMS:    ALLERGIES Allergies  Allergen Reactions  . Anesthetics, Ester   . Ciprofloxacin Hcl   . Other     Pain medications  . Penicillins   . Sulfa Antibiotics     PAST MEDICAL HISTORY Past Medical History:  Diagnosis Date  . DM (diabetes mellitus), type 2, uncontrolled ([facility]) 05/31/2015  . Fibromyalgia   . Polymyositis [facility])    Past Surgical History:  Procedure Laterality Date  . KNEE ARTHROSCOPY    . SPINE SURGERY     back and neck    FAMILY HISTORY Family History  Problem Relation Age of Onset  . Diabetes Mother     SOCIAL HISTORY Social History   Tobacco Use  . Smoking status: Never Smoker  . Smokeless tobacco: Never Used  Substance Use Topics  . Alcohol use: No    Alcohol/week: 0.0 standard drinks  . Drug use: No         OPHTHALMIC EXAM:  Base Eye Exam    Visual Acuity (Snellen - Linear)      Right Left   Dist [place] 20/200 20/200   Dist ph [place] NI NI       Tonometry (Tonopen, 8:52 AM)      Right Left   Pressure 14 13       Pupils  Pupils Dark Light Shape React APD   Right PERRL 4 3 Round Brisk None   Left PERRL 4 3 Round Brisk None       Visual Fields (Counting fingers)      Left Right    Full Full       Extraocular Movement      Right Left    Full Full       Neuro/Psych    Oriented x3: Yes   Mood/Affect: Normal       Dilation    Left eye: 1.0% Mydriacyl, 2.5% Phenylephrine @ 8:52 AM        Slit Lamp and Fundus Exam    External Exam      Right Left   External Normal Normal       Slit Lamp Exam      Right Left   Lids/Lashes Normal Normal   Conjunctiva/Sclera White and quiet White and quiet   Cornea Clear Clear   Anterior Chamber Deep and quiet Deep and quiet   Iris Round and reactive Round and reactive   Lens Posterior chamber intraocular lens Posterior chamber intraocular lens   Anterior Vitreous Normal Normal       Fundus Exam      Right Left   Posterior Vitreous  , Vitrectomized   Disc  Normal   C/D  Ratio  0.25   Macula  Severe clinically significant macular edema, Macular thickening, Microaneurysms   Vessels  Proliferative diabetic retinopathy quiescent   Periphery  Normal          IMAGING AND PROCEDURES  Imaging and Procedures for @TODAY @  OCT, Retina - OU - Both Eyes       Right Eye Quality was good. Scan locations included subfoveal. Central Foveal Thickness: 544. Progression has worsened. Findings include abnormal foveal contour, cystoid macular edema.   Left Eye Central Foveal Thickness: 700. Progression has worsened. Findings include abnormal foveal contour, cystoid macular edema.   Notes CSME due to chronic macular nonperfusion with hard exudate deposition       Focal Laser - OS - Left Eye       Time Out Confirmed correct patient, procedure, site, and patient consented.   Anesthesia Topical anesthesia was used. Anesthetic medications included Proparacaine 0.5%.   Laser Information The type of laser was diode. The duration in seconds was 0.05. The spot size was 100 microns. Laser power was 100. Total spots was 327.   Post-op The patient tolerated the procedure well. There were no complications. The patient received written and verbal post procedure care education.   Notes Circular grid laser photo coagulation place this is the arcades and side the range of exudate in the area of massive retinal thickening                ASSESSMENT/PLAN:  Diabetic macular edema of right eye with proliferative retinopathy associated with type 2 diabetes mellitus (HCC)  The nature of diabetic macular edema was discussed with the patient. Treatment options were outlined including medical therapy, laser & vitrectomy. The use of injectable medications reviewed, including Avastin, Lucentis, and Eylea. Periodic injections into the eye are likely to resolve diabetic macular edema (swelling in the center of vision). Initially, injections are delivered are delivered every 4-6  weeks, and the interval extended as the condition improves. On average, 8-9 injections the first year, and 5 in year 2. Improvement in the condition most often improves on medical therapy. Occasional use of focal laser is also  recommended for residual macular edema (swelling). Excellent control of blood glucose and blood pressure are encouraged under the care of a primary physician or endocrinologist. Similarly, attempts to maintain serum cholesterol, low density lipoproteins, and high-density lipoproteins in a favorable range were recommended.       ICD-10-CM   1. Proliferative diabetic retinopathy of left eye with macular edema associated with type 2 diabetes mellitus (HCC)  E11.3512 OCT, Retina - OU - Both Eyes    Focal Laser - OS - Left Eye  2. Polymyositis (HCC)  M33.20   3. Diabetic macular edema of right eye with proliferative retinopathy associated with type 2 diabetes mellitus (HCC)  E11.3511     1.  2.  3.  Ophthalmic Meds Ordered this visit:  No orders of the defined types were placed in this encounter.      Return in about 4 weeks (around 01/10/2020) for FOCAL, OD, dilate, OCT.  There are no Patient Instructions on file for this visit.   Explained the diagnoses, plan, and follow up with the patient and they expressed understanding.  Patient expressed understanding of the importance of proper follow up care.   Alford Highland Falcon Mccaskey M.D. Diseases & Surgery of the Retina and Vitreous [facility] @TODAY @     Abbreviations: M myopia (nearsighted); A astigmatism; H hyperopia (farsighted); P presbyopia; Mrx spectacle prescription;  CTL contact lenses; OD right eye; OS left eye; OU both eyes  XT exotropia; ET esotropia; PEK punctate epithelial keratitis; PEE punctate epithelial erosions; DES dry eye syndrome; MGD meibomian gland dysfunction; ATs artificial tears; PFAT's preservative free artificial tears; NSC nuclear sclerotic cataract; PSC posterior subcapsular  cataract; ERM epi-retinal membrane; PVD posterior vitreous detachment; RD retinal detachment; DM diabetes mellitus; DR diabetic retinopathy; NPDR non-proliferative diabetic retinopathy; PDR proliferative diabetic retinopathy; CSME clinically significant macular edema; DME diabetic macular edema; dbh dot blot hemorrhages; CWS cotton wool spot; POAG primary open angle glaucoma; C/D cup-to-disc ratio; HVF humphrey visual field; GVF goldmann visual field; OCT optical coherence tomography; IOP intraocular pressure; BRVO Branch retinal vein occlusion; CRVO central retinal vein occlusion; CRAO central retinal artery occlusion; BRAO branch retinal artery occlusion; RT retinal tear; SB scleral buckle; PPV pars plana vitrectomy; VH Vitreous hemorrhage; PRP panretinal laser photocoagulation; IVK intravitreal kenalog; VMT vitreomacular traction; MH Macular hole;  NVD neovascularization of the disc; NVE neovascularization elsewhere; AREDS age related eye disease study; ARMD age related macular degeneration; POAG primary open angle glaucoma; EBMD epithelial/anterior basement membrane dystrophy; ACIOL anterior chamber intraocular lens; IOL intraocular lens; PCIOL posterior chamber intraocular lens; Phaco/IOL phacoemulsification with intraocular lens placement; PRK photorefractive keratectomy; LASIK laser assisted in situ keratomileusis; HTN hypertension; DM diabetes mellitus; COPD chronic obstructive pulmonary disease

## 2019-12-13 NOTE — Assessment & Plan Note (Signed)
The nature of diabetic macular edema was discussed with the patient. Treatment options were outlined including medical therapy, laser & vitrectomy. The use of injectable medications reviewed, including Avastin, Lucentis, and Eylea. Periodic injections into the eye are likely to resolve diabetic macular edema (swelling in the center of vision). Initially, injections are delivered are delivered every 4-6 weeks, and the interval extended as the condition improves. On average, 8-9 injections the first year, and 5 in year 2. Improvement in the condition most often improves on medical therapy. Occasional use of focal laser is also recommended for residual macular edema (swelling). Excellent control of blood glucose and blood pressure are encouraged under the care of a primary physician or endocrinologist. Similarly, attempts to maintain serum cholesterol, low density lipoproteins, and high-density lipoproteins in a favorable range were recommended.  

## 2020-01-07 ENCOUNTER — Ambulatory Visit (INDEPENDENT_AMBULATORY_CARE_PROVIDER_SITE_OTHER): Payer: Medicare Other | Admitting: Ophthalmology

## 2020-01-07 ENCOUNTER — Encounter (INDEPENDENT_AMBULATORY_CARE_PROVIDER_SITE_OTHER): Payer: Self-pay | Admitting: Ophthalmology

## 2020-01-07 ENCOUNTER — Other Ambulatory Visit: Payer: Self-pay

## 2020-01-07 DIAGNOSIS — E113511 Type 2 diabetes mellitus with proliferative diabetic retinopathy with macular edema, right eye: Secondary | ICD-10-CM

## 2020-01-07 NOTE — Progress Notes (Signed)
01/07/2020     CHIEF COMPLAINT Patient presents for Retina Follow Up   HISTORY OF PRESENT ILLNESS: Dawn Sanchez is a 69 y.o. female who presents to the clinic today for:   HPI    Retina Follow Up    Patient presents with  Diabetic Retinopathy.  In right eye.  Duration of 10 weeks.  Since onset it is gradually worsening.          Comments    3 week follow up- OCT OU, Possible Focal OD Patient states that the vision in her left eye is 'fuzzier'. Patient states she has a small, hard bump on her RUL - stating it itches and bothers her.       Last edited by Gerda Diss on 01/07/2020  8:28 AM. (History)      Referring physician: Josetta Huddle, MD [address],  [place] [address]  HISTORICAL INFORMATION:   Selected notes from the [facility]: No current outpatient medications on file. (Ophthalmic Drugs)   No current facility-administered medications for this visit. (Ophthalmic Drugs)   Current Outpatient Medications (Other)  Medication Sig  . apixaban (ELIQUIS) 2.5 MG TABS tablet Take 2.5 mg by mouth 2 (two) times daily.  . famotidine (PEPCID) 20 MG tablet Take 20 mg by mouth 2 (two) times daily.  . insulin aspart (NOVOLOG) 100 UNIT/ML injection Inject into the skin 3 (three) times daily before meals. Sliding Scale  . insulin glargine (LANTUS) 100 UNIT/ML injection Inject 30 Units into the skin at bedtime.   . insulin lispro protamine-lispro (HUMALOG 50/50 MIX) (50-50) 100 UNIT/ML SUSP injection Inject 5 Units into the skin 3 (three) times daily before meals. If CBG >150  . loperamide (IMODIUM) 2 MG capsule Take 2 mg by mouth every 8 (eight) hours as needed for diarrhea or loose stools.   No current facility-administered medications for this visit. (Other)      REVIEW OF SYSTEMS:    ALLERGIES Allergies  Allergen Reactions  . Anesthetics, Ester   . Ciprofloxacin Hcl   . Other     Pain medications  .  Penicillins   . Sulfa Antibiotics     PAST MEDICAL HISTORY Past Medical History:  Diagnosis Date  . DM (diabetes mellitus), type 2, uncontrolled ([facility]) 05/31/2015  . Fibromyalgia   . Polymyositis [facility])    Past Surgical History:  Procedure Laterality Date  . KNEE ARTHROSCOPY    . SPINE SURGERY     back and neck    FAMILY HISTORY Family History  Problem Relation Age of Onset  . Diabetes Mother     SOCIAL HISTORY Social History   Tobacco Use  . Smoking status: Never Smoker  . Smokeless tobacco: Never Used  Substance Use Topics  . Alcohol use: No    Alcohol/week: 0.0 standard drinks  . Drug use: No         OPHTHALMIC EXAM: Base Eye Exam    Visual Acuity (Snellen - Linear)      Right Left   Dist [place] 20/200 20/200-1   Dist ph [place] NI NI       Tonometry (Tonopen, 8:31 AM)      Right Left   Pressure 12 10       Pupils      Pupils Dark Light Shape React APD   Right PERRL 4 3 Round Minimal None   Left PERRL 4 3 Round Minimal None  Visual Fields (Counting fingers)      Left Right    Full Full       Extraocular Movement      Right Left    Full Full       Neuro/Psych    Oriented x3: Yes   Mood/Affect: Normal       Dilation    Right eye: 1.0% Mydriacyl, 2.5% Phenylephrine @ 8:31 AM        Slit Lamp and Fundus Exam    External Exam      Right Left   External Normal Normal       Slit Lamp Exam      Right Left   Lids/Lashes Normal Normal   Conjunctiva/Sclera White and quiet White and quiet   Cornea Clear Clear   Anterior Chamber Deep and quiet Deep and quiet   Iris Round and reactive Round and reactive   Lens Posterior chamber intraocular lens Posterior chamber intraocular lens   Vitreous Normal Normal          IMAGING AND PROCEDURES  Imaging and Procedures for 01/07/20  OCT, Retina - OU - Both Eyes       Right Eye Quality was good. Scan locations included subfoveal. Central Foveal Thickness: 519. Progression has worsened.  Findings include abnormal foveal contour.   Left Eye Quality was good. Scan locations included subfoveal. Central Foveal Thickness: 747. Progression has worsened. Findings include abnormal foveal contour.   Notes Massive clinically significant macular edema OU       Focal Laser - OD - Right Eye       Time Out Confirmed correct patient, procedure, site, and patient consented.   Anesthesia Topical anesthesia was used. Anesthetic medications included Proparacaine 0.5%.   Laser Information The type of laser was diode. Color was yellow. The duration in seconds was 0.04. The spot size was 100 microns. Laser power was 140. Total spots was 343.   Post-op The patient tolerated the procedure well. There were no complications. The patient received written and verbal post procedure care education.   Notes Focal laser applied temporally and inferior to the foveal region in a grid like fashion on the navigated laser, NAVILAS                ASSESSMENT/PLAN:  Diabetic macular edema of right eye with proliferative retinopathy associated with type 2 diabetes mellitus (HCC) Focal laser grid added temporally and inferior to the fovea today.  OD.      ICD-10-CM   1. Diabetic macular edema of right eye with proliferative retinopathy associated with type 2 diabetes mellitus (HCC)  E11.3511 OCT, Retina - OU - Both Eyes    Focal Laser - OD - Right Eye    1.  2.  3.  Ophthalmic Meds Ordered this visit:  No orders of the defined types were placed in this encounter.      No follow-ups on file.  There are no Patient Instructions on file for this visit.   Explained the diagnoses, plan, and follow up with the patient and they expressed understanding.  Patient expressed understanding of the importance of proper follow up care.   Alford Highland Tyra Gural M.D. Diseases & Surgery of the Retina and Vitreous [facility] 01/07/20     Abbreviations: M myopia (nearsighted);  A astigmatism; H hyperopia (farsighted); P presbyopia; Mrx spectacle prescription;  CTL contact lenses; OD right eye; OS left eye; OU both eyes  XT exotropia; ET esotropia; PEK punctate  epithelial keratitis; PEE punctate epithelial erosions; DES dry eye syndrome; MGD meibomian gland dysfunction; ATs artificial tears; PFAT's preservative free artificial tears; [place] nuclear sclerotic cataract; PSC posterior subcapsular cataract; ERM epi-retinal membrane; PVD posterior vitreous detachment; RD retinal detachment; DM diabetes mellitus; DR diabetic retinopathy; NPDR non-proliferative diabetic retinopathy; PDR proliferative diabetic retinopathy; CSME clinically significant macular edema; DME diabetic macular edema; dbh dot blot hemorrhages; CWS cotton wool spot; POAG primary open angle glaucoma; C/D cup-to-disc ratio; HVF humphrey visual field; GVF goldmann visual field; OCT optical coherence tomography; IOP intraocular pressure; BRVO Branch retinal vein occlusion; CRVO central retinal vein occlusion; CRAO central retinal artery occlusion; BRAO branch retinal artery occlusion; RT retinal tear; SB scleral buckle; PPV pars plana vitrectomy; VH Vitreous hemorrhage; PRP panretinal laser photocoagulation; IVK intravitreal kenalog; VMT vitreomacular traction; MH Macular hole;  NVD neovascularization of the disc; NVE neovascularization elsewhere; AREDS age related eye disease study; ARMD age related macular degeneration; POAG primary open angle glaucoma; EBMD epithelial/anterior basement membrane dystrophy; ACIOL anterior chamber intraocular lens; IOL intraocular lens; PCIOL posterior chamber intraocular lens; Phaco/IOL phacoemulsification with intraocular lens placement; [place] photorefractive keratectomy; LASIK laser assisted in situ keratomileusis; HTN hypertension; DM diabetes mellitus; COPD chronic obstructive pulmonary disease

## 2020-01-07 NOTE — Assessment & Plan Note (Signed)
Focal laser grid added temporally and inferior to the fovea today.  OD.

## 2020-04-09 ENCOUNTER — Other Ambulatory Visit: Payer: Self-pay

## 2020-04-09 ENCOUNTER — Encounter (INDEPENDENT_AMBULATORY_CARE_PROVIDER_SITE_OTHER): Payer: Self-pay | Admitting: Ophthalmology

## 2020-04-09 ENCOUNTER — Ambulatory Visit (INDEPENDENT_AMBULATORY_CARE_PROVIDER_SITE_OTHER): Payer: Medicare Other | Admitting: Ophthalmology

## 2020-04-09 DIAGNOSIS — E113512 Type 2 diabetes mellitus with proliferative diabetic retinopathy with macular edema, left eye: Secondary | ICD-10-CM | POA: Diagnosis not present

## 2020-04-09 DIAGNOSIS — E113511 Type 2 diabetes mellitus with proliferative diabetic retinopathy with macular edema, right eye: Secondary | ICD-10-CM | POA: Diagnosis not present

## 2020-04-09 NOTE — Progress Notes (Signed)
04/09/2020     CHIEF COMPLAINT Patient presents for Blurred Vision   HISTORY OF PRESENT ILLNESS: Dawn Sanchez is a 69 y.o. female who presents to the clinic today for:   HPI    Blurred Vision    In both eyes.  Onset was gradual.  Vision is blurred.  Severity is moderate.  This started 4 months ago.  Occurring constantly.  It is worse throughout the day.  Context:  watching TV.  Since onset it is gradually worsening.  Associated symptoms include Negative for eye pain.  Treatments tried include no treatments.  Response to treatment was no improvement.  I, the attending physician,  performed the HPI with the patient and updated documentation appropriately.          Comments    Pt c/o decrease in vision. Pt states while looking at the TV everything is fuzzy. Pt states there is a column of darkness and everything else looks shady and blurry. Pt states this started after her first laser procedure in April and has continued.       Last edited by Elyse Jarvis on 04/09/2020  9:29 AM. (History)      Referring physician: Marden Noble, MD [address] [place],  [place] [address]  HISTORICAL INFORMATION:   Selected notes from the MEDICAL RECORD NUMBER       CURRENT MEDICATIONS: No current outpatient medications on file. (Ophthalmic Drugs)   No current facility-administered medications for this visit. (Ophthalmic Drugs)   Current Outpatient Medications (Other)  Medication Sig  . apixaban (ELIQUIS) 2.5 MG TABS tablet Take 2.5 mg by mouth 2 (two) times daily.  . famotidine (PEPCID) 20 MG tablet Take 20 mg by mouth 2 (two) times daily.  . insulin aspart (NOVOLOG) 100 UNIT/ML injection Inject into the skin 3 (three) times daily before meals. Sliding Scale  . insulin glargine (LANTUS) 100 UNIT/ML injection Inject 30 Units into the skin at bedtime.   . insulin lispro protamine-lispro (HUMALOG 50/50 MIX) (50-50) 100 UNIT/ML SUSP injection Inject 5 Units into the skin 3  (three) times daily before meals. If CBG >150  . loperamide (IMODIUM) 2 MG capsule Take 2 mg by mouth every 8 (eight) hours as needed for diarrhea or loose stools.   No current facility-administered medications for this visit. (Other)      REVIEW OF SYSTEMS:    ALLERGIES Allergies  Allergen Reactions  . Anesthetics, Ester   . Ciprofloxacin Hcl   . Other     Pain medications  . Penicillins   . Sulfa Antibiotics     PAST MEDICAL HISTORY Past Medical History:  Diagnosis Date  . DM (diabetes mellitus), type 2, uncontrolled (HCC) 05/31/2015  . Fibromyalgia   . Polymyositis [facility])    Past Surgical History:  Procedure Laterality Date  . KNEE ARTHROSCOPY    . SPINE SURGERY     back and neck    FAMILY HISTORY Family History  Problem Relation Age of Onset  . Diabetes Mother     SOCIAL HISTORY Social History   Tobacco Use  . Smoking status: Never Smoker  . Smokeless tobacco: Never Used  Substance Use Topics  . Alcohol use: No    Alcohol/week: 0.0 standard drinks  . Drug use: No         OPHTHALMIC EXAM:  Base Eye Exam    Visual Acuity (Snellen - Linear)      Right Left   Dist [place] CF @ 1'  20/200   Dist ph [place] NI NI       Tonometry (Tonopen, 9:33 AM)      Right Left   Pressure 13 13       Pupils      Dark Light Shape React APD   Right 4 3 Round Slow None   Left 4 3 Round Slow None       Visual Fields (Counting fingers)      Left Right    Full Full       Neuro/Psych    Oriented x3: Yes   Mood/Affect: Normal       Dilation    Both eyes: 1.0% Mydriacyl, 2.5% Phenylephrine @ 9:33 AM        Slit Lamp and Fundus Exam    External Exam      Right Left   External Normal Normal       Slit Lamp Exam      Right Left   Lids/Lashes Normal Normal   Conjunctiva/Sclera White and quiet White and quiet   Cornea Clear Clear   Anterior Chamber Deep and quiet Deep and quiet   Iris Round and reactive Round and reactive   Lens Posterior chamber  intraocular lens Posterior chamber intraocular lens   Anterior Vitreous Normal Normal       Fundus Exam      Right Left   Posterior Vitreous Clear, vitrectomized , Vitrectomized   Disc Normal Normal   C/D Ratio 0.4 0.4   Macula Severe thickening continues, large deposit of subfoveal we will submacular exudate on the inferior portion of the cystic edema Severe clinically significant macular edema, Macular thickening, Microaneurysms   Vessels PDR-quiet Proliferative diabetic retinopathy quiescent   Periphery Good PRP, nearly wall-to-wall Good PRP, nearly wall-to-wall          IMAGING AND PROCEDURES  Imaging and Procedures for 04/09/20  OCT, Retina - OU - Both Eyes       Right Eye Quality was good. Scan locations included subfoveal. Central Foveal Thickness: 519. Progression has been stable. Findings include cystoid macular edema.   Left Eye Quality was good. Scan locations included subfoveal. Central Foveal Thickness: 747. Progression has improved. Findings include cystoid macular edema.   Notes Massive CSME, stable OD over time and improved OS over time , the last 3 months.                ASSESSMENT/PLAN:  Diabetic macular edema of right eye with proliferative retinopathy associated with type 2 diabetes mellitus (HCC) Chronic atrophic macular edema,Not responsive to all antivegF therapies and steroids in the past.  No recoverable acuity beyond what currently is present OD  Proliferative diabetic retinopathy of left eye with macular edema associated with type 2 diabetes mellitus (HCC) Atrophic CME OS, of CSME, chronic and yes somewhat improved as compared to last visit May 2021.  Deposits of subfoveal exudates inferior to the macula suggest chronicity but findings are also documented associated with macular nonperfusion, capillary about      ICD-10-CM   1. Diabetic macular edema of right eye with proliferative retinopathy associated with type 2 diabetes mellitus (HCC)   E11.3511 OCT, Retina - OU - Both Eyes  2. Proliferative diabetic retinopathy of left eye with macular edema associated with type 2 diabetes mellitus (HCC)  E11.3512 OCT, Retina - OU - Both Eyes    1.  We will continue to monitor, there are no further neovascular complications of the eyes.  2.  CSME macular  edema OU is from avascular macula, macular nonperfusion which is not recoverable 3.  Ophthalmic Meds Ordered this visit:  No orders of the defined types were placed in this encounter.      Return in about 6 months (around 10/10/2020) for DILATE OU, COLOR FP, OCT.  There are no Patient Instructions on file for this visit.   Explained the diagnoses, plan, and follow up with the patient and they expressed understanding.  Patient expressed understanding of the importance of proper follow up care.   Alford Highland Latravia Southgate M.D. Diseases & Surgery of the Retina and Vitreous [facility] 04/09/20     Abbreviations: M myopia (nearsighted); A astigmatism; H hyperopia (farsighted); P presbyopia; Mrx spectacle prescription;  CTL contact lenses; OD right eye; OS left eye; OU both eyes  XT exotropia; ET esotropia; PEK punctate epithelial keratitis; PEE punctate epithelial erosions; DES dry eye syndrome; MGD meibomian gland dysfunction; ATs artificial tears; PFAT's preservative free artificial tears; NSC nuclear sclerotic cataract; PSC posterior subcapsular cataract; ERM epi-retinal membrane; PVD posterior vitreous detachment; RD retinal detachment; DM diabetes mellitus; DR diabetic retinopathy; NPDR non-proliferative diabetic retinopathy; PDR proliferative diabetic retinopathy; CSME clinically significant macular edema; DME diabetic macular edema; dbh dot blot hemorrhages; CWS cotton wool spot; POAG primary open angle glaucoma; C/D cup-to-disc ratio; HVF humphrey visual field; GVF goldmann visual field; OCT optical coherence tomography; IOP intraocular pressure; BRVO Branch retinal vein  occlusion; CRVO central retinal vein occlusion; CRAO central retinal artery occlusion; BRAO branch retinal artery occlusion; RT retinal tear; SB scleral buckle; PPV pars plana vitrectomy; VH Vitreous hemorrhage; PRP panretinal laser photocoagulation; IVK intravitreal kenalog; VMT vitreomacular traction; MH Macular hole;  NVD neovascularization of the disc; NVE neovascularization elsewhere; AREDS age related eye disease study; ARMD age related macular degeneration; POAG primary open angle glaucoma; EBMD epithelial/anterior basement membrane dystrophy; ACIOL anterior chamber intraocular lens; IOL intraocular lens; PCIOL posterior chamber intraocular lens; Phaco/IOL phacoemulsification with intraocular lens placement; PRK photorefractive keratectomy; LASIK laser assisted in situ keratomileusis; HTN hypertension; DM diabetes mellitus; COPD chronic obstructive pulmonary disease

## 2020-04-09 NOTE — Assessment & Plan Note (Signed)
Atrophic CME OS, of CSME, chronic and yes somewhat improved as compared to last visit May 2021.  Deposits of subfoveal exudates inferior to the macula suggest chronicity but findings are also documented associated with macular nonperfusion, capillary about

## 2020-04-09 NOTE — Assessment & Plan Note (Signed)
Chronic atrophic macular edema,Not responsive to all antivegF therapies and steroids in the past.  No recoverable acuity beyond what currently is present OD

## 2020-04-14 ENCOUNTER — Other Ambulatory Visit: Payer: Self-pay | Admitting: Internal Medicine

## 2020-04-14 DIAGNOSIS — Z1231 Encounter for screening mammogram for malignant neoplasm of breast: Secondary | ICD-10-CM

## 2020-05-12 ENCOUNTER — Encounter (INDEPENDENT_AMBULATORY_CARE_PROVIDER_SITE_OTHER): Payer: Medicare Other | Admitting: Ophthalmology

## 2020-05-12 ENCOUNTER — Encounter (INDEPENDENT_AMBULATORY_CARE_PROVIDER_SITE_OTHER): Payer: Self-pay

## 2020-07-16 ENCOUNTER — Ambulatory Visit
Admission: RE | Admit: 2020-07-16 | Discharge: 2020-07-16 | Disposition: A | Discharge status: Home / Self Care | Payer: Medicare Other | Source: Ambulatory Visit | Attending: Internal Medicine | Admitting: Internal Medicine

## 2020-07-16 ENCOUNTER — Other Ambulatory Visit: Payer: Self-pay

## 2020-07-16 DIAGNOSIS — Z1231 Encounter for screening mammogram for malignant neoplasm of breast: Secondary | ICD-10-CM

## 2020-10-14 ENCOUNTER — Ambulatory Visit (INDEPENDENT_AMBULATORY_CARE_PROVIDER_SITE_OTHER): Payer: Medicare Other | Admitting: Ophthalmology

## 2020-10-14 ENCOUNTER — Other Ambulatory Visit: Payer: Self-pay

## 2020-10-14 ENCOUNTER — Encounter (INDEPENDENT_AMBULATORY_CARE_PROVIDER_SITE_OTHER): Payer: Self-pay | Admitting: Ophthalmology

## 2020-10-14 DIAGNOSIS — E113512 Type 2 diabetes mellitus with proliferative diabetic retinopathy with macular edema, left eye: Secondary | ICD-10-CM

## 2020-10-14 DIAGNOSIS — E113511 Type 2 diabetes mellitus with proliferative diabetic retinopathy with macular edema, right eye: Secondary | ICD-10-CM | POA: Diagnosis not present

## 2020-10-14 NOTE — Assessment & Plan Note (Signed)
Vastly improved macular thickening of the left eye now some 6 to 7 months post focal laser treatment.  Still with cavitary cystoid edema that is not minimal to specific therapy due to the chronic nature of CSME and the the documented macular retinal nonperfusion.

## 2020-10-14 NOTE — Assessment & Plan Note (Signed)
Similarly OD with improved CSME and much less retinal thickening in the nasal aspect of the fovea nonetheless with chronic disease limiting the the acuity in conjunction with subfoveal scarring and exudates from prior massive CSME.

## 2020-10-14 NOTE — Progress Notes (Signed)
10/14/2020     CHIEF COMPLAINT Patient presents for Retina Follow Up (6 Month f\u OU. OCT and FP/Pt states vision  has not been good, especially OD./BGL: 108 this AM)   HISTORY OF PRESENT ILLNESS: Dawn Sanchez is a 70 y.o. female who presents to the clinic today for:   HPI    Retina Follow Up    Patient presents with  Diabetic Retinopathy.  In both eyes.  Severity is moderate.  Duration of 6 months.  Since onset it is stable.  I, the attending physician,  performed the HPI with the patient and updated documentation appropriately. Additional comments: 6 Month f\u OU. OCT and FP Pt states vision  has not been good, especially OD. BGL: 108 this AM       Last edited by Elyse Jarvis on 10/14/2020 10:48 AM. (History)      Referring physician: Marden Noble, MD [address] [place],  [place] [address]  HISTORICAL INFORMATION:   Selected notes from the MEDICAL RECORD NUMBER       CURRENT MEDICATIONS: No current outpatient medications on file. (Ophthalmic Drugs)   No current facility-administered medications for this visit. (Ophthalmic Drugs)   Current Outpatient Medications (Other)  Medication Sig  . apixaban (ELIQUIS) 2.5 MG TABS tablet Take 2.5 mg by mouth 2 (two) times daily.  . famotidine (PEPCID) 20 MG tablet Take 20 mg by mouth 2 (two) times daily.  . insulin aspart (NOVOLOG) 100 UNIT/ML injection Inject into the skin 3 (three) times daily before meals. Sliding Scale  . insulin glargine (LANTUS) 100 UNIT/ML injection Inject 30 Units into the skin at bedtime.   . insulin lispro protamine-lispro (HUMALOG 50/50 MIX) (50-50) 100 UNIT/ML SUSP injection Inject 5 Units into the skin 3 (three) times daily before meals. If CBG >150  . loperamide (IMODIUM) 2 MG capsule Take 2 mg by mouth every 8 (eight) hours as needed for diarrhea or loose stools.   No current facility-administered medications for this visit. (Other)      REVIEW OF  SYSTEMS:    ALLERGIES Allergies  Allergen Reactions  . Anesthetics, Ester   . Ciprofloxacin Hcl   . Other     Pain medications  . Penicillins   . Sulfa Antibiotics     PAST MEDICAL HISTORY Past Medical History:  Diagnosis Date  . DM (diabetes mellitus), type 2, uncontrolled (HCC) 05/31/2015  . Fibromyalgia   . Polymyositis [facility])    Past Surgical History:  Procedure Laterality Date  . KNEE ARTHROSCOPY    . SPINE SURGERY     back and neck    FAMILY HISTORY Family History  Problem Relation Age of Onset  . Diabetes Mother     SOCIAL HISTORY Social History   Tobacco Use  . Smoking status: Never Smoker  . Smokeless tobacco: Never Used  Substance Use Topics  . Alcohol use: No    Alcohol/week: 0.0 standard drinks  . Drug use: No         OPHTHALMIC EXAM: Base Eye Exam    Visual Acuity (Snellen - Linear)      Right Left   Dist [place] E Card @ 4' 20/200   Dist ph [place] NI NI       Tonometry (Tonopen, 10:52 AM)      Right Left   Pressure 14 14       Pupils      Pupils Dark Light Shape React APD   Right PERRL 4  3 Round Slow None   Left PERRL 4 3 Round Slow None       Visual Fields (Counting fingers)      Left Right    Full Full       Neuro/Psych    Oriented x3: Yes   Mood/Affect: Normal       Dilation    Both eyes: 1.0% Mydriacyl, 2.5% Phenylephrine @ 10:52 AM        Slit Lamp and Fundus Exam    External Exam      Right Left   External Normal Normal       Slit Lamp Exam      Right Left   Lids/Lashes Normal Normal   Conjunctiva/Sclera White and quiet White and quiet   Cornea Clear Clear   Anterior Chamber Deep and quiet Deep and quiet   Iris Round and reactive Round and reactive   Lens Posterior chamber intraocular lens Posterior chamber intraocular lens   Anterior Vitreous Normal Normal       Fundus Exam      Right Left   Posterior Vitreous Clear, vitrectomized , Vitrectomized   Disc Normal Normal   C/D Ratio 0.4 0.4   Macula Severe  thickening continues, large deposit of subfoveal we will submacular exudate on the inferior portion of the cystic edema Severe clinically significant macular edema, Macular thickening, Microaneurysms   Vessels PDR-quiet Proliferative diabetic retinopathy quiescent   Periphery Good PRP, nearly wall-to-wall Good PRP, nearly wall-to-wall          IMAGING AND PROCEDURES  Imaging and Procedures for 10/14/20  OCT, Retina - OU - Both Eyes       Right Eye Quality was good. Scan locations included subfoveal. Central Foveal Thickness: 428. Progression has been stable. Findings include cystoid macular edema.   Left Eye Quality was good. Scan locations included subfoveal. Central Foveal Thickness: 530. Progression has improved. Findings include cystoid macular edema.   Notes Massive CSME , stable OD over time and improved OS over time , the last 6 months.   Much improved CSME OU after most recent laser treatment delivered some 7 to 8 months previous year, focal OU.  Nonetheless documented macular nonperfusion in the past limits acuity and active and he has attributed to the massive subfoveal hard exudate deposition which is permanent in both eyes.               Color Fundus Photography Optos - OU - Both Eyes       Right Eye Progression has been stable. Disc findings include normal observations. Macula : exudates.   Left Eye Progression has been stable. Disc findings include normal observations. Macula : exudates.   Notes Bilateral quiescent proliferative diabetic retinopathy, clear media and good PRP peripherally.  Massive macular subfoveal exudates and thickening noted, clinically significant macular edema on the basis of documented macular nonperfusion in the retina.  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  Overall less active CSME after most frequent recent focal laser treatment  nonetheless persistent and likely to progress visual acuity change because of the chronic CME resistant to all medical therapy                ASSESSMENT/PLAN:  Proliferative diabetic retinopathy of left eye with macular edema associated with type 2 diabetes mellitus (HCC) Vastly improved macular thickening of the left eye now some 6 to 7 months post focal laser treatment.  Still  with cavitary cystoid edema that is not minimal to specific therapy due to the chronic nature of CSME and the the documented macular retinal nonperfusion.      ICD-10-CM   1. Diabetic macular edema of right eye with proliferative retinopathy associated with type 2 diabetes mellitus (HCC)  E11.3511 OCT, Retina - OU - Both Eyes    Color Fundus Photography Optos - OU - Both Eyes  2. Proliferative diabetic retinopathy of left eye with macular edema associated with type 2 diabetes mellitus (HCC)  O29.4765 Color Fundus Photography Optos - OU - Both Eyes    1.  Quiescent proliferative diabetic retinopathy with clear media OU.  No risk for vitreous hemorrhage nor neovascularization given the extent of PRP delivered  2.  Regressing CSME OU, due to macular nonperfusion.  Vastly improved macular contour and thickening based on OCT as well as color fundus photography documentation and examination.  Nonetheless visual acuity decline can still occur because of the the advanced extent of the disease  3.  Ophthalmic Meds Ordered this visit:  No orders of the defined types were placed in this encounter.      Return in about 6 months (around 04/13/2021) for DILATE OU, COLOR FP, OCT.  There are no Patient Instructions on file for this visit.   Explained the diagnoses, plan, and follow up with the patient and they expressed understanding.  Patient expressed understanding of the importance of proper follow up care.   Alford Highland Yajayra Feldt M.D. Diseases & Surgery of the Retina and Vitreous [facility] 10/14/20     Abbreviations: M myopia (nearsighted); A astigmatism; H hyperopia (farsighted); P presbyopia; Mrx spectacle prescription;  CTL contact lenses; OD right eye; OS left eye; OU both eyes  XT exotropia; ET esotropia; PEK punctate epithelial keratitis; PEE punctate epithelial erosions; DES dry eye syndrome; MGD meibomian gland dysfunction; ATs artificial tears; PFAT's preservative free artificial tears; NSC nuclear sclerotic cataract; PSC posterior subcapsular cataract; ERM epi-retinal membrane; PVD posterior vitreous detachment; RD retinal detachment; DM diabetes mellitus; DR diabetic retinopathy; NPDR non-proliferative diabetic retinopathy; PDR proliferative diabetic retinopathy; CSME clinically significant macular edema; DME diabetic macular edema; dbh dot blot hemorrhages; CWS cotton wool spot; POAG primary open angle glaucoma; C/D cup-to-disc ratio; HVF humphrey visual field; GVF goldmann visual field; OCT optical coherence tomography; IOP intraocular pressure; BRVO Branch retinal vein occlusion; CRVO central retinal vein occlusion; CRAO central retinal artery occlusion; BRAO branch retinal artery occlusion; RT retinal tear; SB scleral buckle; PPV pars plana vitrectomy; VH Vitreous hemorrhage; PRP panretinal laser photocoagulation; IVK intravitreal kenalog; VMT vitreomacular traction; MH Macular hole;  NVD neovascularization of the disc; NVE neovascularization elsewhere; AREDS age related eye disease study; ARMD age related macular degeneration; POAG primary open angle glaucoma; EBMD epithelial/anterior basement membrane dystrophy; ACIOL anterior chamber intraocular lens; IOL intraocular lens; PCIOL posterior chamber intraocular lens; Phaco/IOL phacoemulsification with intraocular lens placement; PRK photorefractive keratectomy; LASIK laser assisted in situ keratomileusis; HTN hypertension; DM diabetes mellitus; COPD chronic obstructive pulmonary disease

## 2020-10-17 DIAGNOSIS — M797 Fibromyalgia: Secondary | ICD-10-CM | POA: Diagnosis not present

## 2020-10-17 DIAGNOSIS — Z79899 Other long term (current) drug therapy: Secondary | ICD-10-CM | POA: Diagnosis not present

## 2020-10-17 DIAGNOSIS — Z6822 Body mass index (BMI) 22.0-22.9, adult: Secondary | ICD-10-CM | POA: Diagnosis not present

## 2020-10-17 DIAGNOSIS — R531 Weakness: Secondary | ICD-10-CM | POA: Diagnosis not present

## 2020-10-17 DIAGNOSIS — M3320 Polymyositis, organ involvement unspecified: Secondary | ICD-10-CM | POA: Diagnosis not present

## 2021-01-14 DIAGNOSIS — Z6822 Body mass index (BMI) 22.0-22.9, adult: Secondary | ICD-10-CM | POA: Diagnosis not present

## 2021-01-14 DIAGNOSIS — M150 Primary generalized (osteo)arthritis: Secondary | ICD-10-CM | POA: Diagnosis not present

## 2021-01-14 DIAGNOSIS — R531 Weakness: Secondary | ICD-10-CM | POA: Diagnosis not present

## 2021-01-14 DIAGNOSIS — M3320 Polymyositis, organ involvement unspecified: Secondary | ICD-10-CM | POA: Diagnosis not present

## 2021-01-14 DIAGNOSIS — Z79899 Other long term (current) drug therapy: Secondary | ICD-10-CM | POA: Diagnosis not present

## 2021-01-14 DIAGNOSIS — M797 Fibromyalgia: Secondary | ICD-10-CM | POA: Diagnosis not present

## 2021-01-14 DIAGNOSIS — R6889 Other general symptoms and signs: Secondary | ICD-10-CM | POA: Diagnosis not present

## 2021-03-09 DIAGNOSIS — G720 Drug-induced myopathy: Secondary | ICD-10-CM | POA: Diagnosis not present

## 2021-03-09 DIAGNOSIS — E1140 Type 2 diabetes mellitus with diabetic neuropathy, unspecified: Secondary | ICD-10-CM | POA: Diagnosis not present

## 2021-03-09 DIAGNOSIS — R269 Unspecified abnormalities of gait and mobility: Secondary | ICD-10-CM | POA: Diagnosis not present

## 2021-03-09 DIAGNOSIS — E7800 Pure hypercholesterolemia, unspecified: Secondary | ICD-10-CM | POA: Diagnosis not present

## 2021-03-09 DIAGNOSIS — M3322 Polymyositis with myopathy: Secondary | ICD-10-CM | POA: Diagnosis not present

## 2021-03-09 DIAGNOSIS — Z91013 Allergy to seafood: Secondary | ICD-10-CM | POA: Diagnosis not present

## 2021-03-09 DIAGNOSIS — E538 Deficiency of other specified B group vitamins: Secondary | ICD-10-CM | POA: Diagnosis not present

## 2021-03-09 DIAGNOSIS — Z86718 Personal history of other venous thrombosis and embolism: Secondary | ICD-10-CM | POA: Diagnosis not present

## 2021-03-09 DIAGNOSIS — E11319 Type 2 diabetes mellitus with unspecified diabetic retinopathy without macular edema: Secondary | ICD-10-CM | POA: Diagnosis not present

## 2021-04-14 ENCOUNTER — Ambulatory Visit (INDEPENDENT_AMBULATORY_CARE_PROVIDER_SITE_OTHER): Payer: Medicare Other | Admitting: Ophthalmology

## 2021-04-14 ENCOUNTER — Encounter (INDEPENDENT_AMBULATORY_CARE_PROVIDER_SITE_OTHER): Payer: Medicare Other | Admitting: Ophthalmology

## 2021-04-14 ENCOUNTER — Encounter (INDEPENDENT_AMBULATORY_CARE_PROVIDER_SITE_OTHER): Payer: Self-pay | Admitting: Ophthalmology

## 2021-04-14 ENCOUNTER — Other Ambulatory Visit: Payer: Self-pay

## 2021-04-14 DIAGNOSIS — E113511 Type 2 diabetes mellitus with proliferative diabetic retinopathy with macular edema, right eye: Secondary | ICD-10-CM | POA: Diagnosis not present

## 2021-04-14 DIAGNOSIS — E113512 Type 2 diabetes mellitus with proliferative diabetic retinopathy with macular edema, left eye: Secondary | ICD-10-CM

## 2021-04-14 NOTE — Assessment & Plan Note (Signed)
Documented macular nonperfusion angiographically in years past.  Quiet PDR OU with clear media.  Stable condition overall.

## 2021-04-14 NOTE — Progress Notes (Signed)
04/14/2021     CHIEF COMPLAINT Patient presents for  Chief Complaint  Patient presents with   Retina Follow Up    6 Month f\u OU. OCT and FP Pt states vision  has not been good, especially OD. BGL: 108 this AM      HISTORY OF PRESENT ILLNESS: Dawn Sanchez is a 70 y.o. female who presents to the clinic today for:   HPI     Retina Follow Up           Diagnosis: Diabetic Retinopathy   Laterality: both eyes   Onset: 6 months ago   Severity: moderate   Duration: 6 months   Course: stable   MD Performed: performed the HPI with the patient and updated documentation appropriately   Comments: 6 Month f\u OU. OCT and FP Pt states vision  has not been good, especially OD. BGL: 108 this AM         Comments   6 mos fu ou oct fp Pt. has a ciprofloxacin allergy. Patient states vision is stable and unchanged since last visit. Denies any new floaters or FOL. A1C: doesn't remember. BS: 99 this morning      Last edited by Nelva Nay, COA on 04/14/2021  8:01 AM.      Referring physician: Marden Noble, MD [address] [place],  [place] [address]  HISTORICAL INFORMATION:   Selected notes from the MEDICAL RECORD NUMBER       CURRENT MEDICATIONS: No current outpatient medications on file. (Ophthalmic Drugs)   No current facility-administered medications for this visit. (Ophthalmic Drugs)   Current Outpatient Medications (Other)  Medication Sig   apixaban (ELIQUIS) 2.5 MG TABS tablet Take 2.5 mg by mouth 2 (two) times daily.   famotidine (PEPCID) 20 MG tablet Take 20 mg by mouth 2 (two) times daily.   insulin aspart (NOVOLOG) 100 UNIT/ML injection Inject into the skin 3 (three) times daily before meals. Sliding Scale   insulin glargine (LANTUS) 100 UNIT/ML injection Inject 30 Units into the skin at bedtime.    insulin lispro protamine-lispro (HUMALOG 50/50 MIX) (50-50) 100 UNIT/ML SUSP injection Inject 5 Units into the skin 3 (three) times daily  before meals. If CBG >150   loperamide (IMODIUM) 2 MG capsule Take 2 mg by mouth every 8 (eight) hours as needed for diarrhea or loose stools.   No current facility-administered medications for this visit. (Other)      REVIEW OF SYSTEMS:    ALLERGIES Allergies  Allergen Reactions   Anesthetics, Ester    Ciprofloxacin Hcl    Other     Pain medications   Penicillins    Sulfa Antibiotics     PAST MEDICAL HISTORY Past Medical History:  Diagnosis Date   DM (diabetes mellitus), type 2, uncontrolled (HCC) 05/31/2015   Fibromyalgia    Polymyositis (HCC)    Past Surgical History:  Procedure Laterality Date   KNEE ARTHROSCOPY     SPINE SURGERY     back and neck    FAMILY HISTORY Family History  Problem Relation Age of Onset   Diabetes Mother     SOCIAL HISTORY Social History   Tobacco Use   Smoking status: Never   Smokeless tobacco: Never  Substance Use Topics   Alcohol use: No    Alcohol/week: 0.0 standard drinks   Drug use: No         OPHTHALMIC EXAM:  Base Eye Exam     Visual Acuity (  Snellen - Linear)       Right Left   Dist [place] 20/400 20/200   Dist ph [place] NI NI         Tonometry (Tonopen, 8:04 AM)       Right Left   Pressure 10 11         Pupils       Pupils Dark Light Shape React APD   Right PERRL 4 3 Round Brisk None   Left PERRL 4 3 Round Brisk None         Visual Fields (Counting fingers)       Left Right    Full Full         Extraocular Movement       Right Left    Full Full         Neuro/Psych     Oriented x3: Yes   Mood/Affect: Normal         Dilation     Both eyes: 1.0% Mydriacyl, 2.5% Phenylephrine @ 8:04 AM           Slit Lamp and Fundus Exam     External Exam       Right Left   External Normal Normal         Slit Lamp Exam       Right Left   Lids/Lashes Normal Normal   Conjunctiva/Sclera White and quiet White and quiet   Cornea Clear Clear   Anterior Chamber Deep and quiet Deep  and quiet   Iris Round and reactive Round and reactive   Lens Posterior chamber intraocular lens Posterior chamber intraocular lens   Anterior Vitreous Normal Normal         Fundus Exam       Right Left   Posterior Vitreous Clear, vitrectomized , Vitrectomized   Disc Normal Normal   C/D Ratio 0.4 0.4   Macula Severe thickening continues, large deposit of subfoveal we will submacular exudate on the inferior portion of the cystic edema Severe clinically significant macular edema, Macular thickening, Microaneurysms, extensive subretinal exudate throughout the posterior pole, history of proven macular nonperfusion OU   Vessels PDR-quiet Proliferative diabetic retinopathy quiescent   Periphery Good PRP, nearly wall-to-wall Good PRP, nearly wall-to-wall            IMAGING AND PROCEDURES  Imaging and Procedures for 04/14/21  OCT, Retina - OU - Both Eyes       Right Eye Quality was good. Scan locations included subfoveal. Central Foveal Thickness: 276. Progression has been stable. Findings include cystoid macular edema.   Left Eye Quality was good. Scan locations included subfoveal. Central Foveal Thickness: 581. Progression has improved. Findings include cystoid macular edema.   Notes Massive CSME , stable OD over time and improved OS over time , the last 6 months.   Much improved CSME OU after most recent laser treatment delivered some 7 to 8 months previous year, focal OU.  Nonetheless documented macular nonperfusion in the past limits acuity and active and he has attributed to the massive subfoveal hard exudate deposition which is permanent in both eyes.             Color Fundus Photography Optos - OU - Both Eyes       Right Eye Progression has been stable. Disc findings include normal observations. Macula : exudates.   Left Eye Progression has been stable. Disc findings include normal observations. Macula : exudates.   Notes Bilateral quiescent proliferative  diabetic  retinopathy, clear media and good PRP peripherally.  Massive macular subfoveal exudates and thickening noted, clinically significant macular edema on the basis of documented macular nonperfusion in the retina.  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  Overall less active CSME after most frequent recent focal laser treatment nonetheless persistent and likely to progress visual acuity change because of the chronic CME resistant to all medical therapy             ASSESSMENT/PLAN:  No problem-specific Assessment & Plan notes found for this encounter.      ICD-10-CM   1. Diabetic macular edema of right eye with proliferative retinopathy associated with type 2 diabetes mellitus (HCC)  E11.3511 OCT, Retina - OU - Both Eyes    Color Fundus Photography Optos - OU - Both Eyes    2. Proliferative diabetic retinopathy of left eye with macular edema associated with type 2 diabetes mellitus (HCC)  E11.3512 OCT, Retina - OU - Both Eyes    Color Fundus Photography Optos - OU - Both Eyes      1.  We will continue to observe OU.  No sign of of  PDR progression or reactivation.  2.  Patient working with services for the blind for telescopic assistance to see the television  3.  Ophthalmic Meds Ordered this visit:  No orders of the defined types were placed in this encounter.      Return in about 9 months (around 01/12/2022) for DILATE OU, COLOR FP, OCT.  There are no Patient Instructions on file for this visit.   Explained the diagnoses, plan, and follow up with the patient and they expressed understanding.  Patient expressed understanding of the importance of proper follow up care.   Alford Highland Jalissa Heinzelman M.D. Diseases & Surgery of the Retina and Vitreous [facility] 04/14/21     Abbreviations: M myopia (nearsighted); A astigmatism; H hyperopia (farsighted); P  presbyopia; Mrx spectacle prescription;  CTL contact lenses; OD right eye; OS left eye; OU both eyes  XT exotropia; ET esotropia; PEK punctate epithelial keratitis; PEE punctate epithelial erosions; DES dry eye syndrome; MGD meibomian gland dysfunction; ATs artificial tears; PFAT's preservative free artificial tears; NSC nuclear sclerotic cataract; PSC posterior subcapsular cataract; ERM epi-retinal membrane; PVD posterior vitreous detachment; RD retinal detachment; DM diabetes mellitus; DR diabetic retinopathy; NPDR non-proliferative diabetic retinopathy; PDR proliferative diabetic retinopathy; CSME clinically significant macular edema; DME diabetic macular edema; dbh dot blot hemorrhages; CWS cotton wool spot; POAG primary open angle glaucoma; C/D cup-to-disc ratio; HVF humphrey visual field; GVF goldmann visual field; OCT optical coherence tomography; IOP intraocular pressure; BRVO Branch retinal vein occlusion; CRVO central retinal vein occlusion; CRAO central retinal artery occlusion; BRAO branch retinal artery occlusion; RT retinal tear; SB scleral buckle; PPV pars plana vitrectomy; VH Vitreous hemorrhage; PRP panretinal laser photocoagulation; IVK intravitreal kenalog; VMT vitreomacular traction; MH Macular hole;  NVD neovascularization of the disc; NVE neovascularization elsewhere; AREDS age related eye disease study; ARMD age related macular degeneration; POAG primary open angle glaucoma; EBMD epithelial/anterior basement membrane dystrophy; ACIOL anterior chamber intraocular lens; IOL intraocular lens; PCIOL posterior chamber intraocular lens; Phaco/IOL phacoemulsification with intraocular lens placement; PRK photorefractive keratectomy; LASIK laser assisted in situ keratomileusis; HTN hypertension; DM diabetes mellitus; COPD chronic obstructive pulmonary disease

## 2021-04-14 NOTE — Assessment & Plan Note (Signed)
Macular nonperfusion document on prior fluorescein angiography accounts for the massive subretinal subfoveal exudative changes which overall remained stable and in fact improved OD.  Some subfoveal scarring resulting

## 2021-04-21 DIAGNOSIS — M3320 Polymyositis, organ involvement unspecified: Secondary | ICD-10-CM | POA: Diagnosis not present

## 2021-05-21 ENCOUNTER — Other Ambulatory Visit: Payer: Self-pay | Admitting: Internal Medicine

## 2021-05-21 DIAGNOSIS — Z1231 Encounter for screening mammogram for malignant neoplasm of breast: Secondary | ICD-10-CM

## 2021-06-15 DIAGNOSIS — E113513 Type 2 diabetes mellitus with proliferative diabetic retinopathy with macular edema, bilateral: Secondary | ICD-10-CM | POA: Diagnosis not present

## 2021-06-15 DIAGNOSIS — Z794 Long term (current) use of insulin: Secondary | ICD-10-CM | POA: Diagnosis not present

## 2021-06-15 DIAGNOSIS — Z961 Presence of intraocular lens: Secondary | ICD-10-CM | POA: Diagnosis not present

## 2021-06-15 DIAGNOSIS — H02821 Cysts of right upper eyelid: Secondary | ICD-10-CM | POA: Diagnosis not present

## 2021-07-20 ENCOUNTER — Ambulatory Visit
Admission: RE | Admit: 2021-07-20 | Discharge: 2021-07-20 | Disposition: A | Discharge status: Home / Self Care | Payer: Medicare Other | Source: Ambulatory Visit | Attending: Internal Medicine | Admitting: Internal Medicine

## 2021-07-20 DIAGNOSIS — Z1231 Encounter for screening mammogram for malignant neoplasm of breast: Secondary | ICD-10-CM | POA: Diagnosis not present

## 2021-07-21 DIAGNOSIS — M150 Primary generalized (osteo)arthritis: Secondary | ICD-10-CM | POA: Diagnosis not present

## 2021-07-21 DIAGNOSIS — M797 Fibromyalgia: Secondary | ICD-10-CM | POA: Diagnosis not present

## 2021-07-21 DIAGNOSIS — M3320 Polymyositis, organ involvement unspecified: Secondary | ICD-10-CM | POA: Diagnosis not present

## 2021-07-21 DIAGNOSIS — Z79899 Other long term (current) drug therapy: Secondary | ICD-10-CM | POA: Diagnosis not present

## 2021-07-21 DIAGNOSIS — Z6821 Body mass index (BMI) 21.0-21.9, adult: Secondary | ICD-10-CM | POA: Diagnosis not present

## 2021-07-31 DIAGNOSIS — M3320 Polymyositis, organ involvement unspecified: Secondary | ICD-10-CM | POA: Diagnosis not present

## 2021-07-31 DIAGNOSIS — R6889 Other general symptoms and signs: Secondary | ICD-10-CM | POA: Diagnosis not present

## 2021-08-10 DIAGNOSIS — Z86718 Personal history of other venous thrombosis and embolism: Secondary | ICD-10-CM | POA: Diagnosis not present

## 2021-08-10 DIAGNOSIS — E11319 Type 2 diabetes mellitus with unspecified diabetic retinopathy without macular edema: Secondary | ICD-10-CM | POA: Diagnosis not present

## 2021-08-10 DIAGNOSIS — E538 Deficiency of other specified B group vitamins: Secondary | ICD-10-CM | POA: Diagnosis not present

## 2021-08-10 DIAGNOSIS — Z91013 Allergy to seafood: Secondary | ICD-10-CM | POA: Diagnosis not present

## 2021-08-10 DIAGNOSIS — E7800 Pure hypercholesterolemia, unspecified: Secondary | ICD-10-CM | POA: Diagnosis not present

## 2021-08-10 DIAGNOSIS — R269 Unspecified abnormalities of gait and mobility: Secondary | ICD-10-CM | POA: Diagnosis not present

## 2021-08-10 DIAGNOSIS — E039 Hypothyroidism, unspecified: Secondary | ICD-10-CM | POA: Diagnosis not present

## 2021-08-10 DIAGNOSIS — Z0000 Encounter for general adult medical examination without abnormal findings: Secondary | ICD-10-CM | POA: Diagnosis not present

## 2021-08-10 DIAGNOSIS — Z1389 Encounter for screening for other disorder: Secondary | ICD-10-CM | POA: Diagnosis not present

## 2021-08-10 DIAGNOSIS — M3322 Polymyositis with myopathy: Secondary | ICD-10-CM | POA: Diagnosis not present

## 2021-08-10 DIAGNOSIS — Z1211 Encounter for screening for malignant neoplasm of colon: Secondary | ICD-10-CM | POA: Diagnosis not present

## 2021-08-10 DIAGNOSIS — E559 Vitamin D deficiency, unspecified: Secondary | ICD-10-CM | POA: Diagnosis not present

## 2021-08-10 DIAGNOSIS — E1140 Type 2 diabetes mellitus with diabetic neuropathy, unspecified: Secondary | ICD-10-CM | POA: Diagnosis not present

## 2021-09-27 DIAGNOSIS — M797 Fibromyalgia: Secondary | ICD-10-CM | POA: Diagnosis not present

## 2021-09-27 DIAGNOSIS — R059 Cough, unspecified: Secondary | ICD-10-CM | POA: Diagnosis not present

## 2021-09-27 DIAGNOSIS — R079 Chest pain, unspecified: Secondary | ICD-10-CM | POA: Diagnosis not present

## 2021-09-27 DIAGNOSIS — M791 Myalgia, unspecified site: Secondary | ICD-10-CM | POA: Diagnosis not present

## 2021-09-27 DIAGNOSIS — Z20822 Contact with and (suspected) exposure to covid-19: Secondary | ICD-10-CM | POA: Diagnosis not present

## 2021-09-27 DIAGNOSIS — E1165 Type 2 diabetes mellitus with hyperglycemia: Secondary | ICD-10-CM | POA: Diagnosis not present

## 2021-09-27 DIAGNOSIS — G8929 Other chronic pain: Secondary | ICD-10-CM | POA: Diagnosis not present

## 2021-09-27 DIAGNOSIS — E876 Hypokalemia: Secondary | ICD-10-CM | POA: Diagnosis not present

## 2021-09-27 DIAGNOSIS — Z794 Long term (current) use of insulin: Secondary | ICD-10-CM | POA: Diagnosis not present

## 2021-09-27 DIAGNOSIS — R809 Proteinuria, unspecified: Secondary | ICD-10-CM | POA: Diagnosis not present

## 2021-09-27 DIAGNOSIS — R053 Chronic cough: Secondary | ICD-10-CM | POA: Diagnosis not present

## 2021-09-28 DIAGNOSIS — E878 Other disorders of electrolyte and fluid balance, not elsewhere classified: Secondary | ICD-10-CM | POA: Diagnosis not present

## 2021-10-08 DIAGNOSIS — R42 Dizziness and giddiness: Secondary | ICD-10-CM | POA: Diagnosis not present

## 2021-10-08 DIAGNOSIS — Z7901 Long term (current) use of anticoagulants: Secondary | ICD-10-CM | POA: Diagnosis not present

## 2021-10-08 DIAGNOSIS — G894 Chronic pain syndrome: Secondary | ICD-10-CM | POA: Diagnosis not present

## 2021-10-08 DIAGNOSIS — R9431 Abnormal electrocardiogram [ECG] [EKG]: Secondary | ICD-10-CM | POA: Diagnosis not present

## 2021-10-08 DIAGNOSIS — Z79899 Other long term (current) drug therapy: Secondary | ICD-10-CM | POA: Diagnosis not present

## 2021-10-08 DIAGNOSIS — M797 Fibromyalgia: Secondary | ICD-10-CM | POA: Diagnosis not present

## 2021-10-08 DIAGNOSIS — R6889 Other general symptoms and signs: Secondary | ICD-10-CM | POA: Diagnosis not present

## 2021-10-08 DIAGNOSIS — I6523 Occlusion and stenosis of bilateral carotid arteries: Secondary | ICD-10-CM | POA: Diagnosis not present

## 2021-10-08 DIAGNOSIS — G319 Degenerative disease of nervous system, unspecified: Secondary | ICD-10-CM | POA: Diagnosis not present

## 2021-10-08 DIAGNOSIS — E785 Hyperlipidemia, unspecified: Secondary | ICD-10-CM | POA: Diagnosis not present

## 2021-10-08 DIAGNOSIS — R930 Abnormal findings on diagnostic imaging of skull and head, not elsewhere classified: Secondary | ICD-10-CM | POA: Diagnosis not present

## 2021-10-08 DIAGNOSIS — R404 Transient alteration of awareness: Secondary | ICD-10-CM | POA: Diagnosis not present

## 2021-10-08 DIAGNOSIS — D649 Anemia, unspecified: Secondary | ICD-10-CM | POA: Diagnosis not present

## 2021-10-08 DIAGNOSIS — E11649 Type 2 diabetes mellitus with hypoglycemia without coma: Secondary | ICD-10-CM | POA: Diagnosis not present

## 2021-10-08 DIAGNOSIS — R059 Cough, unspecified: Secondary | ICD-10-CM | POA: Diagnosis not present

## 2021-10-08 DIAGNOSIS — Z741 Need for assistance with personal care: Secondary | ICD-10-CM | POA: Diagnosis not present

## 2021-10-08 DIAGNOSIS — Z86718 Personal history of other venous thrombosis and embolism: Secondary | ICD-10-CM | POA: Diagnosis not present

## 2021-10-08 DIAGNOSIS — I10 Essential (primary) hypertension: Secondary | ICD-10-CM | POA: Diagnosis not present

## 2021-10-08 DIAGNOSIS — Z7409 Other reduced mobility: Secondary | ICD-10-CM | POA: Diagnosis not present

## 2021-10-08 DIAGNOSIS — Z743 Need for continuous supervision: Secondary | ICD-10-CM | POA: Diagnosis not present

## 2021-10-08 DIAGNOSIS — E876 Hypokalemia: Secondary | ICD-10-CM | POA: Diagnosis not present

## 2021-10-08 DIAGNOSIS — M3320 Polymyositis, organ involvement unspecified: Secondary | ICD-10-CM | POA: Diagnosis not present

## 2021-10-09 DIAGNOSIS — M3320 Polymyositis, organ involvement unspecified: Secondary | ICD-10-CM | POA: Diagnosis not present

## 2021-10-09 DIAGNOSIS — Z7901 Long term (current) use of anticoagulants: Secondary | ICD-10-CM | POA: Diagnosis not present

## 2021-10-09 DIAGNOSIS — Z794 Long term (current) use of insulin: Secondary | ICD-10-CM | POA: Diagnosis not present

## 2021-10-09 DIAGNOSIS — E118 Type 2 diabetes mellitus with unspecified complications: Secondary | ICD-10-CM | POA: Diagnosis not present

## 2021-10-09 DIAGNOSIS — R42 Dizziness and giddiness: Secondary | ICD-10-CM | POA: Diagnosis not present

## 2021-10-09 DIAGNOSIS — Z86718 Personal history of other venous thrombosis and embolism: Secondary | ICD-10-CM | POA: Diagnosis not present

## 2021-10-09 DIAGNOSIS — M797 Fibromyalgia: Secondary | ICD-10-CM | POA: Diagnosis not present

## 2021-10-09 DIAGNOSIS — G894 Chronic pain syndrome: Secondary | ICD-10-CM | POA: Diagnosis not present

## 2021-10-10 DIAGNOSIS — G894 Chronic pain syndrome: Secondary | ICD-10-CM | POA: Diagnosis not present

## 2021-10-10 DIAGNOSIS — Z86718 Personal history of other venous thrombosis and embolism: Secondary | ICD-10-CM | POA: Diagnosis not present

## 2021-10-10 DIAGNOSIS — M797 Fibromyalgia: Secondary | ICD-10-CM | POA: Diagnosis not present

## 2021-10-10 DIAGNOSIS — R42 Dizziness and giddiness: Secondary | ICD-10-CM | POA: Diagnosis not present

## 2021-10-10 DIAGNOSIS — Z794 Long term (current) use of insulin: Secondary | ICD-10-CM | POA: Diagnosis not present

## 2021-10-10 DIAGNOSIS — E118 Type 2 diabetes mellitus with unspecified complications: Secondary | ICD-10-CM | POA: Diagnosis not present

## 2021-10-10 DIAGNOSIS — M3320 Polymyositis, organ involvement unspecified: Secondary | ICD-10-CM | POA: Diagnosis not present

## 2021-10-10 DIAGNOSIS — Z7901 Long term (current) use of anticoagulants: Secondary | ICD-10-CM | POA: Diagnosis not present

## 2021-10-15 DIAGNOSIS — M797 Fibromyalgia: Secondary | ICD-10-CM | POA: Diagnosis not present

## 2021-10-15 DIAGNOSIS — Z86718 Personal history of other venous thrombosis and embolism: Secondary | ICD-10-CM | POA: Diagnosis not present

## 2021-10-15 DIAGNOSIS — Z7901 Long term (current) use of anticoagulants: Secondary | ICD-10-CM | POA: Diagnosis not present

## 2021-10-15 DIAGNOSIS — E11311 Type 2 diabetes mellitus with unspecified diabetic retinopathy with macular edema: Secondary | ICD-10-CM | POA: Diagnosis not present

## 2021-10-15 DIAGNOSIS — Z9181 History of falling: Secondary | ICD-10-CM | POA: Diagnosis not present

## 2021-10-15 DIAGNOSIS — E878 Other disorders of electrolyte and fluid balance, not elsewhere classified: Secondary | ICD-10-CM | POA: Diagnosis not present

## 2021-10-15 DIAGNOSIS — Z8673 Personal history of transient ischemic attack (TIA), and cerebral infarction without residual deficits: Secondary | ICD-10-CM | POA: Diagnosis not present

## 2021-10-15 DIAGNOSIS — G894 Chronic pain syndrome: Secondary | ICD-10-CM | POA: Diagnosis not present

## 2021-10-15 DIAGNOSIS — M3320 Polymyositis, organ involvement unspecified: Secondary | ICD-10-CM | POA: Diagnosis not present

## 2021-10-15 DIAGNOSIS — E785 Hyperlipidemia, unspecified: Secondary | ICD-10-CM | POA: Diagnosis not present

## 2021-10-15 DIAGNOSIS — Z794 Long term (current) use of insulin: Secondary | ICD-10-CM | POA: Diagnosis not present

## 2021-10-16 DIAGNOSIS — M797 Fibromyalgia: Secondary | ICD-10-CM | POA: Diagnosis not present

## 2021-10-16 DIAGNOSIS — Z86718 Personal history of other venous thrombosis and embolism: Secondary | ICD-10-CM | POA: Diagnosis not present

## 2021-10-16 DIAGNOSIS — Z794 Long term (current) use of insulin: Secondary | ICD-10-CM | POA: Diagnosis not present

## 2021-10-16 DIAGNOSIS — Z8673 Personal history of transient ischemic attack (TIA), and cerebral infarction without residual deficits: Secondary | ICD-10-CM | POA: Diagnosis not present

## 2021-10-16 DIAGNOSIS — E878 Other disorders of electrolyte and fluid balance, not elsewhere classified: Secondary | ICD-10-CM | POA: Diagnosis not present

## 2021-10-16 DIAGNOSIS — M3320 Polymyositis, organ involvement unspecified: Secondary | ICD-10-CM | POA: Diagnosis not present

## 2021-10-16 DIAGNOSIS — Z9181 History of falling: Secondary | ICD-10-CM | POA: Diagnosis not present

## 2021-10-16 DIAGNOSIS — E11311 Type 2 diabetes mellitus with unspecified diabetic retinopathy with macular edema: Secondary | ICD-10-CM | POA: Diagnosis not present

## 2021-10-16 DIAGNOSIS — G894 Chronic pain syndrome: Secondary | ICD-10-CM | POA: Diagnosis not present

## 2021-10-16 DIAGNOSIS — Z7901 Long term (current) use of anticoagulants: Secondary | ICD-10-CM | POA: Diagnosis not present

## 2021-10-16 DIAGNOSIS — E785 Hyperlipidemia, unspecified: Secondary | ICD-10-CM | POA: Diagnosis not present

## 2021-10-19 DIAGNOSIS — Z7901 Long term (current) use of anticoagulants: Secondary | ICD-10-CM | POA: Diagnosis not present

## 2021-10-19 DIAGNOSIS — G894 Chronic pain syndrome: Secondary | ICD-10-CM | POA: Diagnosis not present

## 2021-10-19 DIAGNOSIS — E878 Other disorders of electrolyte and fluid balance, not elsewhere classified: Secondary | ICD-10-CM | POA: Diagnosis not present

## 2021-10-19 DIAGNOSIS — M3320 Polymyositis, organ involvement unspecified: Secondary | ICD-10-CM | POA: Diagnosis not present

## 2021-10-19 DIAGNOSIS — Z8673 Personal history of transient ischemic attack (TIA), and cerebral infarction without residual deficits: Secondary | ICD-10-CM | POA: Diagnosis not present

## 2021-10-19 DIAGNOSIS — M797 Fibromyalgia: Secondary | ICD-10-CM | POA: Diagnosis not present

## 2021-10-19 DIAGNOSIS — Z9181 History of falling: Secondary | ICD-10-CM | POA: Diagnosis not present

## 2021-10-19 DIAGNOSIS — E11311 Type 2 diabetes mellitus with unspecified diabetic retinopathy with macular edema: Secondary | ICD-10-CM | POA: Diagnosis not present

## 2021-10-19 DIAGNOSIS — E785 Hyperlipidemia, unspecified: Secondary | ICD-10-CM | POA: Diagnosis not present

## 2021-10-19 DIAGNOSIS — Z86718 Personal history of other venous thrombosis and embolism: Secondary | ICD-10-CM | POA: Diagnosis not present

## 2021-10-19 DIAGNOSIS — Z794 Long term (current) use of insulin: Secondary | ICD-10-CM | POA: Diagnosis not present

## 2021-10-20 DIAGNOSIS — M797 Fibromyalgia: Secondary | ICD-10-CM | POA: Diagnosis not present

## 2021-10-20 DIAGNOSIS — E878 Other disorders of electrolyte and fluid balance, not elsewhere classified: Secondary | ICD-10-CM | POA: Diagnosis not present

## 2021-10-20 DIAGNOSIS — E785 Hyperlipidemia, unspecified: Secondary | ICD-10-CM | POA: Diagnosis not present

## 2021-10-20 DIAGNOSIS — Z86718 Personal history of other venous thrombosis and embolism: Secondary | ICD-10-CM | POA: Diagnosis not present

## 2021-10-20 DIAGNOSIS — Z7901 Long term (current) use of anticoagulants: Secondary | ICD-10-CM | POA: Diagnosis not present

## 2021-10-20 DIAGNOSIS — G894 Chronic pain syndrome: Secondary | ICD-10-CM | POA: Diagnosis not present

## 2021-10-20 DIAGNOSIS — Z794 Long term (current) use of insulin: Secondary | ICD-10-CM | POA: Diagnosis not present

## 2021-10-20 DIAGNOSIS — E11311 Type 2 diabetes mellitus with unspecified diabetic retinopathy with macular edema: Secondary | ICD-10-CM | POA: Diagnosis not present

## 2021-10-20 DIAGNOSIS — M3320 Polymyositis, organ involvement unspecified: Secondary | ICD-10-CM | POA: Diagnosis not present

## 2021-10-20 DIAGNOSIS — Z9181 History of falling: Secondary | ICD-10-CM | POA: Diagnosis not present

## 2021-10-20 DIAGNOSIS — Z8673 Personal history of transient ischemic attack (TIA), and cerebral infarction without residual deficits: Secondary | ICD-10-CM | POA: Diagnosis not present

## 2021-10-21 DIAGNOSIS — Z794 Long term (current) use of insulin: Secondary | ICD-10-CM | POA: Diagnosis not present

## 2021-10-21 DIAGNOSIS — M3320 Polymyositis, organ involvement unspecified: Secondary | ICD-10-CM | POA: Diagnosis not present

## 2021-10-21 DIAGNOSIS — Z8673 Personal history of transient ischemic attack (TIA), and cerebral infarction without residual deficits: Secondary | ICD-10-CM | POA: Diagnosis not present

## 2021-10-21 DIAGNOSIS — E878 Other disorders of electrolyte and fluid balance, not elsewhere classified: Secondary | ICD-10-CM | POA: Diagnosis not present

## 2021-10-21 DIAGNOSIS — E785 Hyperlipidemia, unspecified: Secondary | ICD-10-CM | POA: Diagnosis not present

## 2021-10-21 DIAGNOSIS — Z7901 Long term (current) use of anticoagulants: Secondary | ICD-10-CM | POA: Diagnosis not present

## 2021-10-21 DIAGNOSIS — G894 Chronic pain syndrome: Secondary | ICD-10-CM | POA: Diagnosis not present

## 2021-10-21 DIAGNOSIS — Z9181 History of falling: Secondary | ICD-10-CM | POA: Diagnosis not present

## 2021-10-21 DIAGNOSIS — E11311 Type 2 diabetes mellitus with unspecified diabetic retinopathy with macular edema: Secondary | ICD-10-CM | POA: Diagnosis not present

## 2021-10-21 DIAGNOSIS — M797 Fibromyalgia: Secondary | ICD-10-CM | POA: Diagnosis not present

## 2021-10-21 DIAGNOSIS — Z86718 Personal history of other venous thrombosis and embolism: Secondary | ICD-10-CM | POA: Diagnosis not present

## 2021-10-22 DIAGNOSIS — E878 Other disorders of electrolyte and fluid balance, not elsewhere classified: Secondary | ICD-10-CM | POA: Diagnosis not present

## 2021-10-22 DIAGNOSIS — E11311 Type 2 diabetes mellitus with unspecified diabetic retinopathy with macular edema: Secondary | ICD-10-CM | POA: Diagnosis not present

## 2021-10-22 DIAGNOSIS — G894 Chronic pain syndrome: Secondary | ICD-10-CM | POA: Diagnosis not present

## 2021-10-22 DIAGNOSIS — Z8673 Personal history of transient ischemic attack (TIA), and cerebral infarction without residual deficits: Secondary | ICD-10-CM | POA: Diagnosis not present

## 2021-10-22 DIAGNOSIS — M797 Fibromyalgia: Secondary | ICD-10-CM | POA: Diagnosis not present

## 2021-10-22 DIAGNOSIS — E785 Hyperlipidemia, unspecified: Secondary | ICD-10-CM | POA: Diagnosis not present

## 2021-10-22 DIAGNOSIS — Z86718 Personal history of other venous thrombosis and embolism: Secondary | ICD-10-CM | POA: Diagnosis not present

## 2021-10-22 DIAGNOSIS — Z7901 Long term (current) use of anticoagulants: Secondary | ICD-10-CM | POA: Diagnosis not present

## 2021-10-22 DIAGNOSIS — Z794 Long term (current) use of insulin: Secondary | ICD-10-CM | POA: Diagnosis not present

## 2021-10-22 DIAGNOSIS — Z9181 History of falling: Secondary | ICD-10-CM | POA: Diagnosis not present

## 2021-10-22 DIAGNOSIS — M3320 Polymyositis, organ involvement unspecified: Secondary | ICD-10-CM | POA: Diagnosis not present

## 2021-10-23 DIAGNOSIS — M3320 Polymyositis, organ involvement unspecified: Secondary | ICD-10-CM | POA: Diagnosis not present

## 2021-10-23 DIAGNOSIS — Z7901 Long term (current) use of anticoagulants: Secondary | ICD-10-CM | POA: Diagnosis not present

## 2021-10-23 DIAGNOSIS — Z8673 Personal history of transient ischemic attack (TIA), and cerebral infarction without residual deficits: Secondary | ICD-10-CM | POA: Diagnosis not present

## 2021-10-23 DIAGNOSIS — Z9181 History of falling: Secondary | ICD-10-CM | POA: Diagnosis not present

## 2021-10-23 DIAGNOSIS — E785 Hyperlipidemia, unspecified: Secondary | ICD-10-CM | POA: Diagnosis not present

## 2021-10-23 DIAGNOSIS — E878 Other disorders of electrolyte and fluid balance, not elsewhere classified: Secondary | ICD-10-CM | POA: Diagnosis not present

## 2021-10-23 DIAGNOSIS — Z794 Long term (current) use of insulin: Secondary | ICD-10-CM | POA: Diagnosis not present

## 2021-10-23 DIAGNOSIS — G894 Chronic pain syndrome: Secondary | ICD-10-CM | POA: Diagnosis not present

## 2021-10-23 DIAGNOSIS — M797 Fibromyalgia: Secondary | ICD-10-CM | POA: Diagnosis not present

## 2021-10-23 DIAGNOSIS — Z86718 Personal history of other venous thrombosis and embolism: Secondary | ICD-10-CM | POA: Diagnosis not present

## 2021-10-23 DIAGNOSIS — E11311 Type 2 diabetes mellitus with unspecified diabetic retinopathy with macular edema: Secondary | ICD-10-CM | POA: Diagnosis not present

## 2021-10-26 DIAGNOSIS — E876 Hypokalemia: Secondary | ICD-10-CM | POA: Diagnosis not present

## 2021-10-26 DIAGNOSIS — R531 Weakness: Secondary | ICD-10-CM | POA: Diagnosis not present

## 2021-10-26 DIAGNOSIS — E039 Hypothyroidism, unspecified: Secondary | ICD-10-CM | POA: Diagnosis not present

## 2021-10-26 DIAGNOSIS — E11319 Type 2 diabetes mellitus with unspecified diabetic retinopathy without macular edema: Secondary | ICD-10-CM | POA: Diagnosis not present

## 2021-10-26 DIAGNOSIS — M3322 Polymyositis with myopathy: Secondary | ICD-10-CM | POA: Diagnosis not present

## 2021-10-27 DIAGNOSIS — E878 Other disorders of electrolyte and fluid balance, not elsewhere classified: Secondary | ICD-10-CM | POA: Diagnosis not present

## 2021-10-27 DIAGNOSIS — E11311 Type 2 diabetes mellitus with unspecified diabetic retinopathy with macular edema: Secondary | ICD-10-CM | POA: Diagnosis not present

## 2021-10-27 DIAGNOSIS — Z86718 Personal history of other venous thrombosis and embolism: Secondary | ICD-10-CM | POA: Diagnosis not present

## 2021-10-27 DIAGNOSIS — Z7901 Long term (current) use of anticoagulants: Secondary | ICD-10-CM | POA: Diagnosis not present

## 2021-10-27 DIAGNOSIS — E785 Hyperlipidemia, unspecified: Secondary | ICD-10-CM | POA: Diagnosis not present

## 2021-10-27 DIAGNOSIS — M797 Fibromyalgia: Secondary | ICD-10-CM | POA: Diagnosis not present

## 2021-10-27 DIAGNOSIS — G894 Chronic pain syndrome: Secondary | ICD-10-CM | POA: Diagnosis not present

## 2021-10-27 DIAGNOSIS — Z794 Long term (current) use of insulin: Secondary | ICD-10-CM | POA: Diagnosis not present

## 2021-10-27 DIAGNOSIS — M3320 Polymyositis, organ involvement unspecified: Secondary | ICD-10-CM | POA: Diagnosis not present

## 2021-10-27 DIAGNOSIS — Z8673 Personal history of transient ischemic attack (TIA), and cerebral infarction without residual deficits: Secondary | ICD-10-CM | POA: Diagnosis not present

## 2021-10-27 DIAGNOSIS — Z9181 History of falling: Secondary | ICD-10-CM | POA: Diagnosis not present

## 2021-10-29 DIAGNOSIS — E11311 Type 2 diabetes mellitus with unspecified diabetic retinopathy with macular edema: Secondary | ICD-10-CM | POA: Diagnosis not present

## 2021-10-29 DIAGNOSIS — E878 Other disorders of electrolyte and fluid balance, not elsewhere classified: Secondary | ICD-10-CM | POA: Diagnosis not present

## 2021-10-29 DIAGNOSIS — Z9181 History of falling: Secondary | ICD-10-CM | POA: Diagnosis not present

## 2021-10-29 DIAGNOSIS — Z86718 Personal history of other venous thrombosis and embolism: Secondary | ICD-10-CM | POA: Diagnosis not present

## 2021-10-29 DIAGNOSIS — Z7901 Long term (current) use of anticoagulants: Secondary | ICD-10-CM | POA: Diagnosis not present

## 2021-10-29 DIAGNOSIS — M3320 Polymyositis, organ involvement unspecified: Secondary | ICD-10-CM | POA: Diagnosis not present

## 2021-10-29 DIAGNOSIS — Z794 Long term (current) use of insulin: Secondary | ICD-10-CM | POA: Diagnosis not present

## 2021-10-29 DIAGNOSIS — G894 Chronic pain syndrome: Secondary | ICD-10-CM | POA: Diagnosis not present

## 2021-10-29 DIAGNOSIS — Z8673 Personal history of transient ischemic attack (TIA), and cerebral infarction without residual deficits: Secondary | ICD-10-CM | POA: Diagnosis not present

## 2021-10-29 DIAGNOSIS — E785 Hyperlipidemia, unspecified: Secondary | ICD-10-CM | POA: Diagnosis not present

## 2021-10-29 DIAGNOSIS — M797 Fibromyalgia: Secondary | ICD-10-CM | POA: Diagnosis not present

## 2021-11-02 DIAGNOSIS — E878 Other disorders of electrolyte and fluid balance, not elsewhere classified: Secondary | ICD-10-CM | POA: Diagnosis not present

## 2021-11-02 DIAGNOSIS — M797 Fibromyalgia: Secondary | ICD-10-CM | POA: Diagnosis not present

## 2021-11-02 DIAGNOSIS — Z86718 Personal history of other venous thrombosis and embolism: Secondary | ICD-10-CM | POA: Diagnosis not present

## 2021-11-02 DIAGNOSIS — G894 Chronic pain syndrome: Secondary | ICD-10-CM | POA: Diagnosis not present

## 2021-11-02 DIAGNOSIS — E11311 Type 2 diabetes mellitus with unspecified diabetic retinopathy with macular edema: Secondary | ICD-10-CM | POA: Diagnosis not present

## 2021-11-02 DIAGNOSIS — Z9181 History of falling: Secondary | ICD-10-CM | POA: Diagnosis not present

## 2021-11-02 DIAGNOSIS — Z7901 Long term (current) use of anticoagulants: Secondary | ICD-10-CM | POA: Diagnosis not present

## 2021-11-02 DIAGNOSIS — E785 Hyperlipidemia, unspecified: Secondary | ICD-10-CM | POA: Diagnosis not present

## 2021-11-02 DIAGNOSIS — Z794 Long term (current) use of insulin: Secondary | ICD-10-CM | POA: Diagnosis not present

## 2021-11-02 DIAGNOSIS — M3320 Polymyositis, organ involvement unspecified: Secondary | ICD-10-CM | POA: Diagnosis not present

## 2021-11-02 DIAGNOSIS — Z8673 Personal history of transient ischemic attack (TIA), and cerebral infarction without residual deficits: Secondary | ICD-10-CM | POA: Diagnosis not present

## 2021-11-03 DIAGNOSIS — Z8673 Personal history of transient ischemic attack (TIA), and cerebral infarction without residual deficits: Secondary | ICD-10-CM | POA: Diagnosis not present

## 2021-11-03 DIAGNOSIS — M3320 Polymyositis, organ involvement unspecified: Secondary | ICD-10-CM | POA: Diagnosis not present

## 2021-11-03 DIAGNOSIS — E878 Other disorders of electrolyte and fluid balance, not elsewhere classified: Secondary | ICD-10-CM | POA: Diagnosis not present

## 2021-11-03 DIAGNOSIS — G894 Chronic pain syndrome: Secondary | ICD-10-CM | POA: Diagnosis not present

## 2021-11-03 DIAGNOSIS — Z86718 Personal history of other venous thrombosis and embolism: Secondary | ICD-10-CM | POA: Diagnosis not present

## 2021-11-03 DIAGNOSIS — E11311 Type 2 diabetes mellitus with unspecified diabetic retinopathy with macular edema: Secondary | ICD-10-CM | POA: Diagnosis not present

## 2021-11-03 DIAGNOSIS — Z794 Long term (current) use of insulin: Secondary | ICD-10-CM | POA: Diagnosis not present

## 2021-11-03 DIAGNOSIS — M797 Fibromyalgia: Secondary | ICD-10-CM | POA: Diagnosis not present

## 2021-11-03 DIAGNOSIS — Z7901 Long term (current) use of anticoagulants: Secondary | ICD-10-CM | POA: Diagnosis not present

## 2021-11-03 DIAGNOSIS — E785 Hyperlipidemia, unspecified: Secondary | ICD-10-CM | POA: Diagnosis not present

## 2021-11-03 DIAGNOSIS — Z9181 History of falling: Secondary | ICD-10-CM | POA: Diagnosis not present

## 2021-11-05 DIAGNOSIS — E878 Other disorders of electrolyte and fluid balance, not elsewhere classified: Secondary | ICD-10-CM | POA: Diagnosis not present

## 2021-11-05 DIAGNOSIS — Z794 Long term (current) use of insulin: Secondary | ICD-10-CM | POA: Diagnosis not present

## 2021-11-05 DIAGNOSIS — Z86718 Personal history of other venous thrombosis and embolism: Secondary | ICD-10-CM | POA: Diagnosis not present

## 2021-11-05 DIAGNOSIS — E785 Hyperlipidemia, unspecified: Secondary | ICD-10-CM | POA: Diagnosis not present

## 2021-11-05 DIAGNOSIS — M797 Fibromyalgia: Secondary | ICD-10-CM | POA: Diagnosis not present

## 2021-11-05 DIAGNOSIS — G894 Chronic pain syndrome: Secondary | ICD-10-CM | POA: Diagnosis not present

## 2021-11-05 DIAGNOSIS — Z9181 History of falling: Secondary | ICD-10-CM | POA: Diagnosis not present

## 2021-11-05 DIAGNOSIS — E11311 Type 2 diabetes mellitus with unspecified diabetic retinopathy with macular edema: Secondary | ICD-10-CM | POA: Diagnosis not present

## 2021-11-05 DIAGNOSIS — Z7901 Long term (current) use of anticoagulants: Secondary | ICD-10-CM | POA: Diagnosis not present

## 2021-11-05 DIAGNOSIS — Z8673 Personal history of transient ischemic attack (TIA), and cerebral infarction without residual deficits: Secondary | ICD-10-CM | POA: Diagnosis not present

## 2021-11-05 DIAGNOSIS — M3320 Polymyositis, organ involvement unspecified: Secondary | ICD-10-CM | POA: Diagnosis not present

## 2021-11-18 DIAGNOSIS — I6529 Occlusion and stenosis of unspecified carotid artery: Secondary | ICD-10-CM | POA: Diagnosis not present

## 2021-11-18 DIAGNOSIS — I6523 Occlusion and stenosis of bilateral carotid arteries: Secondary | ICD-10-CM | POA: Diagnosis not present

## 2021-12-07 DIAGNOSIS — I6523 Occlusion and stenosis of bilateral carotid arteries: Secondary | ICD-10-CM | POA: Diagnosis not present

## 2021-12-07 DIAGNOSIS — Z789 Other specified health status: Secondary | ICD-10-CM | POA: Diagnosis not present

## 2022-01-12 ENCOUNTER — Ambulatory Visit (INDEPENDENT_AMBULATORY_CARE_PROVIDER_SITE_OTHER): Payer: Medicare Other | Admitting: Ophthalmology

## 2022-01-12 ENCOUNTER — Encounter (INDEPENDENT_AMBULATORY_CARE_PROVIDER_SITE_OTHER): Payer: Self-pay | Admitting: Ophthalmology

## 2022-01-12 DIAGNOSIS — E113512 Type 2 diabetes mellitus with proliferative diabetic retinopathy with macular edema, left eye: Secondary | ICD-10-CM

## 2022-01-12 DIAGNOSIS — H35372 Puckering of macula, left eye: Secondary | ICD-10-CM | POA: Diagnosis not present

## 2022-01-12 DIAGNOSIS — E113511 Type 2 diabetes mellitus with proliferative diabetic retinopathy with macular edema, right eye: Secondary | ICD-10-CM | POA: Diagnosis not present

## 2022-01-12 NOTE — Progress Notes (Signed)
01/12/2022     CHIEF COMPLAINT Patient presents for  Chief Complaint  Patient presents with   Diabetic Retinopathy with Macular Edema      HISTORY OF PRESENT ILLNESS: Dawn Sanchez is a 71 y.o. female who presents to the clinic today for:   HPI   9 mos fu OU OCT FP. Patient allergy to ciprofloxacin. Patient states vision is stable and unchanged since last visit. Denies any new floaters or FOL. Patient reports vision fluctuates. LBS: 140 this morning, patient reports. Last A1C: "It was under 7.0, it was good," patient states. Last edited by Laurin Coder on 01/12/2022  7:50 AM.      Referring physician: Josetta Huddle, MD [address],  [place] [id number]  HISTORICAL INFORMATION:   Selected notes from the [place]: No current outpatient medications on file. (Ophthalmic Drugs)   No current facility-administered medications for this visit. (Ophthalmic Drugs)   Current Outpatient Medications (Other)  Medication Sig   apixaban (ELIQUIS) 2.5 MG TABS tablet Take 2.5 mg by mouth 2 (two) times daily.   famotidine (PEPCID) 20 MG tablet Take 20 mg by mouth 2 (two) times daily.   insulin aspart (NOVOLOG) 100 UNIT/ML injection Inject into the skin 3 (three) times daily before meals. Sliding Scale   insulin glargine (LANTUS) 100 UNIT/ML injection Inject 30 Units into the skin at bedtime.    insulin lispro protamine-lispro (HUMALOG 50/50 MIX) (50-50) 100 UNIT/ML SUSP injection Inject 5 Units into the skin 3 (three) times daily before meals. If CBG >150   loperamide (IMODIUM) 2 MG capsule Take 2 mg by mouth every 8 (eight) hours as needed for diarrhea or loose stools.   No current facility-administered medications for this visit. (Other)      REVIEW OF SYSTEMS: ROS   Negative for: Constitutional, Gastrointestinal, Neurological, Skin, Genitourinary, Musculoskeletal, HENT, Endocrine, Cardiovascular, Eyes,  Respiratory, Psychiatric, Allergic/Imm, Heme/Lymph Last edited by Hurman Horn, MD on 01/12/2022  8:39 AM.       ALLERGIES Allergies  Allergen Reactions   Anesthetics, Ester    Ciprofloxacin Hcl    Other     Pain medications   Penicillins    Sulfa Antibiotics     PAST MEDICAL HISTORY Past Medical History:  Diagnosis Date   DM (diabetes mellitus), type 2, uncontrolled 05/31/2015   Fibromyalgia    Polymyositis ([place])    Past Surgical History:  Procedure Laterality Date   KNEE ARTHROSCOPY     SPINE SURGERY     back and neck    FAMILY HISTORY Family History  Problem Relation Age of Onset   Diabetes Mother     SOCIAL HISTORY Social History   Tobacco Use   Smoking status: Never   Smokeless tobacco: Never  Substance Use Topics   Alcohol use: No    Alcohol/week: 0.0 standard drinks   Drug use: No         OPHTHALMIC EXAM:  Base Eye Exam     Visual Acuity (ETDRS)       Right Left   Dist [place] 20/160 20/200   Dist ph [place] NI 20/160 -1         Tonometry (Tonopen, 7:55 AM)       Right Left   Pressure 7 7         Pupils       Pupils Dark Light APD   Right PERRL 4 3  None   Left PERRL 4 3 None         Visual Fields (Counting fingers)       Left Right    Full Full         Extraocular Movement       Right Left    Full Full         Neuro/Psych     Oriented x3: Yes   Mood/Affect: Normal         Dilation     Both eyes: 1.0% Mydriacyl, 2.5% Phenylephrine @ 7:55 AM           Slit Lamp and Fundus Exam     External Exam       Right Left   External Normal Normal         Slit Lamp Exam       Right Left   Lids/Lashes Normal Normal   Conjunctiva/Sclera White and quiet White and quiet   Cornea Clear Clear   Anterior Chamber Deep and quiet Deep and quiet   Iris Round and reactive Round and reactive   Lens Posterior chamber intraocular lens Posterior chamber intraocular lens   Anterior Vitreous Normal Normal          Fundus Exam       Right Left   Posterior Vitreous Clear, vitrectomized , Vitrectomized   Disc Normal Normal   C/D Ratio 0.4 0.5   Macula Severe thickening continues, large deposit of subfoveal we will submacular exudate on the inferior portion of the cystic edema Severe clinically significant macular edema, Macular thickening, Microaneurysms, extensive subretinal exudate throughout the posterior pole, history of proven macular nonperfusion OU, Epiretinal membrane   Vessels PDR-quiet Proliferative diabetic retinopathy quiescent   Periphery Good PRP, nearly wall-to-wall Good PRP, nearly wall-to-wall            IMAGING AND PROCEDURES  Imaging and Procedures for 01/12/22  OCT, Retina - OU - Both Eyes       Right Eye Quality was good. Scan locations included subfoveal. Central Foveal Thickness: 327. Progression has been stable. Findings include cystoid macular edema.   Left Eye Quality was good. Scan locations included subfoveal. Central Foveal Thickness: 541. Progression has improved. Findings include cystoid macular edema, epiretinal membrane.   Notes Massive CSME , stable OD over time  the last 9 months.  With residual atrophy limiting acuity OD  OS persistent macular thickening and with an epiretinal membrane more pronounced now.   Much improved CSME OU after most recent laser treatment delivered some 7 to 8 months previous year, focal OU.  Nonetheless documented macular nonperfusion in the past limits acuity and active and he has attributed to the massive subfoveal hard exudate deposition which is permanent in both eyes.             Color Fundus Photography Optos - OU - Both Eyes       Right Eye Progression has been stable. Disc findings include normal observations. Macula : exudates.   Left Eye Progression has been stable. Disc findings include normal observations. Macula : exudates.   Notes Bilateral quiescent proliferative diabetic retinopathy, clear media and  good PRP peripherally.  Massive macular subfoveal exudates and thickening noted, clinically significant macular edema on the basis of documented macular nonperfusion in the retina.  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  Overall less active CSME after most frequent recent focal laser treatment nonetheless persistent  and likely to progress visual acuity change because of the chronic CME resistant to all medical therapy  OS however does have epiretinal membrane which could be triggering residual CSME in the remainder of the clinicians macula outside the fovea.             ASSESSMENT/PLAN:  Left epiretinal membrane May benefit from vitrectomy membrane peel of ILM OS to resolve the perimacular, outside the fovea CME, CSME.  Proliferative diabetic retinopathy of left eye with macular edema associated with type 2 diabetes mellitus (HCC) Chronic active CSME with proven macular nonperfusion made worse by epiretinal membrane  Diabetic macular edema of right eye with proliferative retinopathy associated with type 2 diabetes mellitus (HCC) Chronic with proven macular nonperfusion residual scarring now atrophy, no treatment required OD     ICD-10-CM   1. Diabetic macular edema of right eye with proliferative retinopathy associated with type 2 diabetes mellitus (HCC)  E11.3511 OCT, Retina - OU - Both Eyes    Color Fundus Photography Optos - OU - Both Eyes    2. Left epiretinal membrane  [id number]     3. Proliferative diabetic retinopathy of left eye with macular edema associated with type 2 diabetes mellitus ([place])  [id number]       1.  OS with epiretinal membrane triggering perimacular (around the fovea) CSME to remain chronic.  Consider vitrectomy membrane peel this ILM left eye  2.  The foveal atrophy, no active CSME also with macular nonperfusion.  3.  Reassured the patient that surgery has not  about a 70% chance of stabilizing but more importantly a chance to improve the acuity around the center of the fovea to allow for enhanced ambulatory functioning later in life  4.Patient to contact the office if she would like to proceed with the effort to maximize visual potential long-term in the left eye.  Ophthalmic Meds Ordered this visit:  No orders of the defined types were placed in this encounter.      Return ,,,[place], LMAC, for Schedule vitrectomy, membrane [id number], OS,, or 5 months dilate OU OCT.  There are no Patient Instructions on file for this visit.   Explained the diagnoses, plan, and follow up with the patient and they expressed understanding.  Patient expressed understanding of the importance of proper follow up care.   Clent Demark Terah Robey M.D. Diseases & Surgery of the Retina and Vitreous [facility] 01/12/22     Abbreviations: M myopia (nearsighted); A astigmatism; H hyperopia (farsighted); P presbyopia; Mrx spectacle prescription;  CTL contact lenses; OD right eye; OS left eye; OU both eyes  XT exotropia; ET esotropia; PEK punctate epithelial keratitis; PEE punctate epithelial erosions; DES dry eye syndrome; MGD meibomian gland dysfunction; ATs artificial tears; PFAT's preservative free artificial tears; [place] nuclear sclerotic cataract; PSC posterior subcapsular cataract; ERM epi-retinal membrane; PVD posterior vitreous detachment; RD retinal detachment; DM diabetes mellitus; DR diabetic retinopathy; NPDR non-proliferative diabetic retinopathy; PDR proliferative diabetic retinopathy; CSME clinically significant macular edema; DME diabetic macular edema; dbh dot blot hemorrhages; CWS cotton wool spot; POAG primary open angle glaucoma; C/D cup-to-disc ratio; HVF humphrey visual field; GVF goldmann visual field; OCT optical coherence tomography; IOP intraocular pressure; BRVO Branch retinal vein occlusion; CRVO central retinal vein occlusion; CRAO central  retinal artery occlusion; BRAO branch retinal artery occlusion; RT retinal tear; SB scleral buckle; PPV pars plana vitrectomy; VH Vitreous hemorrhage; PRP panretinal laser photocoagulation; IVK intravitreal kenalog; VMT vitreomacular traction; MH Macular hole;  NVD neovascularization of the  disc; NVE neovascularization elsewhere; AREDS age related eye disease study; ARMD age related macular degeneration; POAG primary open angle glaucoma; EBMD epithelial/anterior basement membrane dystrophy; ACIOL anterior chamber intraocular lens; IOL intraocular lens; PCIOL posterior chamber intraocular lens; Phaco/IOL phacoemulsification with intraocular lens placement; [place] photorefractive keratectomy; LASIK laser assisted in situ keratomileusis; HTN hypertension; DM diabetes mellitus; COPD chronic obstructive pulmonary disease

## 2022-01-12 NOTE — Assessment & Plan Note (Signed)
May benefit from vitrectomy membrane peel of ILM OS to resolve the perimacular, outside the fovea CME, CSME.

## 2022-01-12 NOTE — Assessment & Plan Note (Signed)
Chronic active CSME with proven macular nonperfusion made worse by epiretinal membrane

## 2022-01-12 NOTE — Assessment & Plan Note (Signed)
Chronic with proven macular nonperfusion residual scarring now atrophy, no treatment required OD

## 2022-01-26 DIAGNOSIS — M797 Fibromyalgia: Secondary | ICD-10-CM | POA: Diagnosis not present

## 2022-01-26 DIAGNOSIS — Z681 Body mass index (BMI) 19 or less, adult: Secondary | ICD-10-CM | POA: Diagnosis not present

## 2022-01-26 DIAGNOSIS — Z79899 Other long term (current) drug therapy: Secondary | ICD-10-CM | POA: Diagnosis not present

## 2022-01-26 DIAGNOSIS — M810 Age-related osteoporosis without current pathological fracture: Secondary | ICD-10-CM | POA: Diagnosis not present

## 2022-01-26 DIAGNOSIS — M3320 Polymyositis, organ involvement unspecified: Secondary | ICD-10-CM | POA: Diagnosis not present

## 2022-02-08 DIAGNOSIS — R269 Unspecified abnormalities of gait and mobility: Secondary | ICD-10-CM | POA: Diagnosis not present

## 2022-02-08 DIAGNOSIS — Z91013 Allergy to seafood: Secondary | ICD-10-CM | POA: Diagnosis not present

## 2022-02-08 DIAGNOSIS — E1140 Type 2 diabetes mellitus with diabetic neuropathy, unspecified: Secondary | ICD-10-CM | POA: Diagnosis not present

## 2022-02-08 DIAGNOSIS — Z1211 Encounter for screening for malignant neoplasm of colon: Secondary | ICD-10-CM | POA: Diagnosis not present

## 2022-02-08 DIAGNOSIS — M810 Age-related osteoporosis without current pathological fracture: Secondary | ICD-10-CM | POA: Diagnosis not present

## 2022-02-08 DIAGNOSIS — E11319 Type 2 diabetes mellitus with unspecified diabetic retinopathy without macular edema: Secondary | ICD-10-CM | POA: Diagnosis not present

## 2022-02-08 DIAGNOSIS — E7800 Pure hypercholesterolemia, unspecified: Secondary | ICD-10-CM | POA: Diagnosis not present

## 2022-02-08 DIAGNOSIS — M3322 Polymyositis with myopathy: Secondary | ICD-10-CM | POA: Diagnosis not present

## 2022-02-08 DIAGNOSIS — E538 Deficiency of other specified B group vitamins: Secondary | ICD-10-CM | POA: Diagnosis not present

## 2022-02-08 DIAGNOSIS — Z86718 Personal history of other venous thrombosis and embolism: Secondary | ICD-10-CM | POA: Diagnosis not present

## 2022-04-27 DIAGNOSIS — M3320 Polymyositis, organ involvement unspecified: Secondary | ICD-10-CM | POA: Diagnosis not present

## 2022-05-26 DIAGNOSIS — I6529 Occlusion and stenosis of unspecified carotid artery: Secondary | ICD-10-CM | POA: Diagnosis not present

## 2022-05-26 DIAGNOSIS — I6523 Occlusion and stenosis of bilateral carotid arteries: Secondary | ICD-10-CM | POA: Diagnosis not present

## 2022-06-07 DIAGNOSIS — I6523 Occlusion and stenosis of bilateral carotid arteries: Secondary | ICD-10-CM | POA: Diagnosis not present

## 2022-06-07 DIAGNOSIS — Z789 Other specified health status: Secondary | ICD-10-CM | POA: Diagnosis not present

## 2022-06-14 ENCOUNTER — Encounter (INDEPENDENT_AMBULATORY_CARE_PROVIDER_SITE_OTHER): Payer: Medicare Other | Admitting: Ophthalmology

## 2022-06-15 DIAGNOSIS — E113513 Type 2 diabetes mellitus with proliferative diabetic retinopathy with macular edema, bilateral: Secondary | ICD-10-CM | POA: Diagnosis not present

## 2022-06-15 DIAGNOSIS — Z961 Presence of intraocular lens: Secondary | ICD-10-CM | POA: Diagnosis not present

## 2022-06-15 DIAGNOSIS — Z794 Long term (current) use of insulin: Secondary | ICD-10-CM | POA: Diagnosis not present

## 2022-07-22 IMAGING — MG MM DIGITAL SCREENING BILAT W/ TOMO AND CAD
8 series · 8 of 24 positions shown · non-contrast
Comparison: Previous exam(s).

CLINICAL DATA: Screening.

EXAM:
DIGITAL SCREENING BILATERAL MAMMOGRAM WITH TOMOSYNTHESIS AND CAD
TECHNIQUE: Bilateral screening digital craniocaudal and mediolateral oblique
mammograms were obtained. Bilateral screening digital breast
tomosynthesis was performed. The images were evaluated with
computer-aided detection.

[R CC synth-2D]
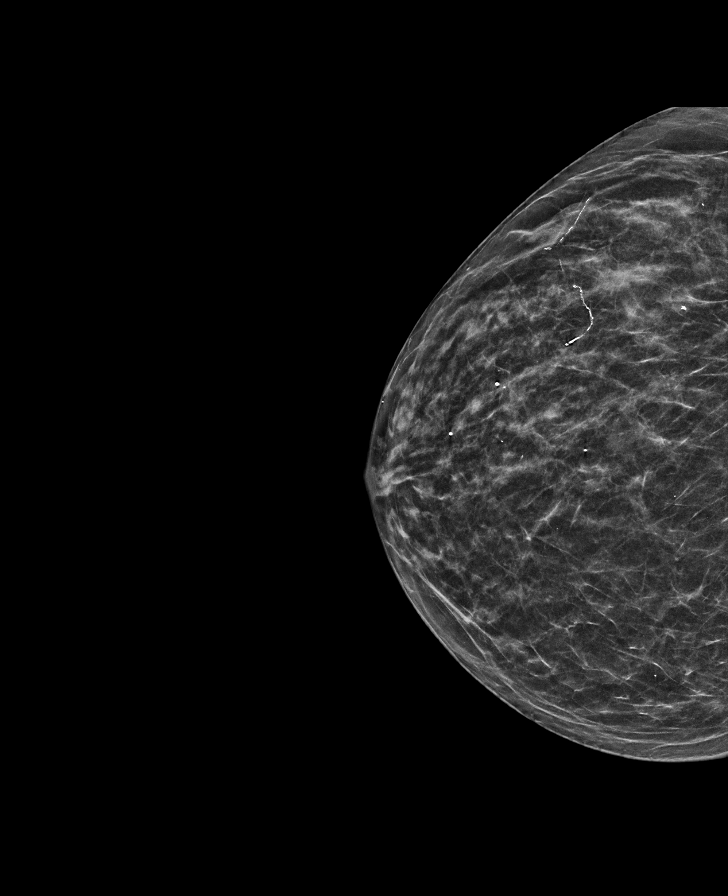

[R MLO synth-2D]
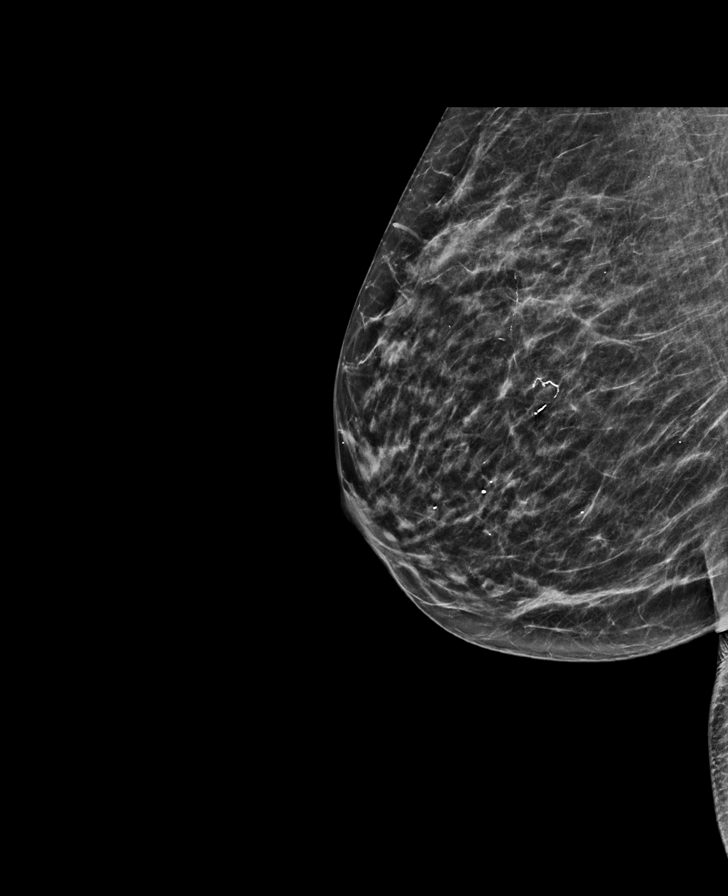

[L MLO synth-2D]
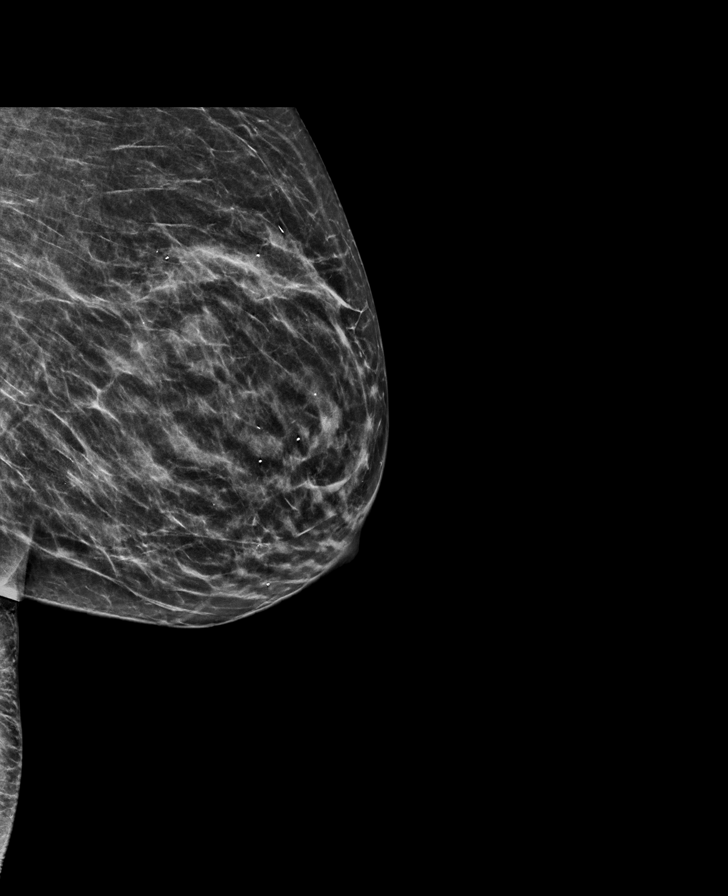

[L CC synth-2D]
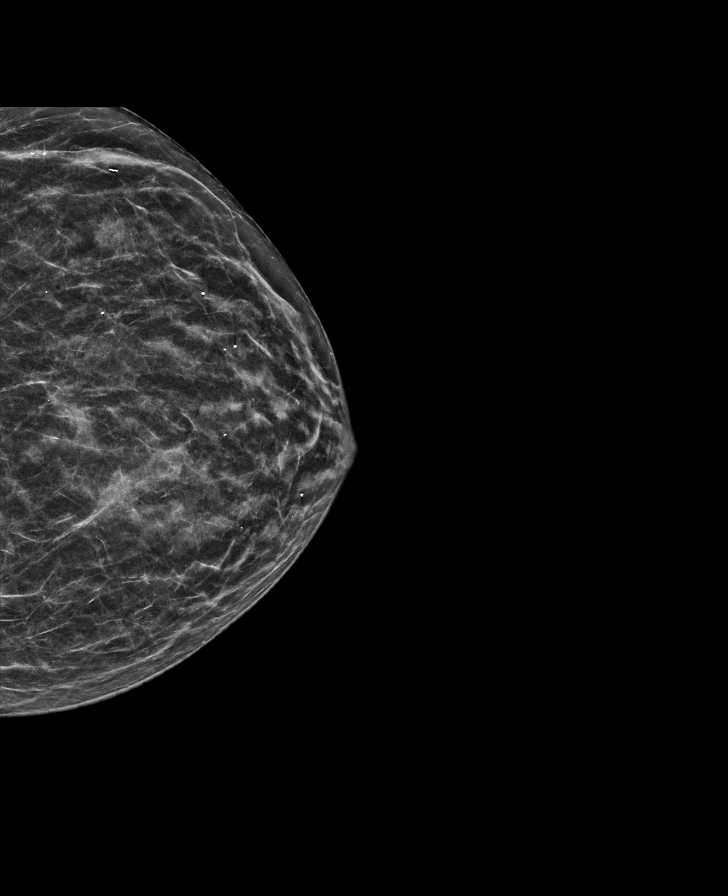

[R MLO tomo · tomo slice 27/54.0]
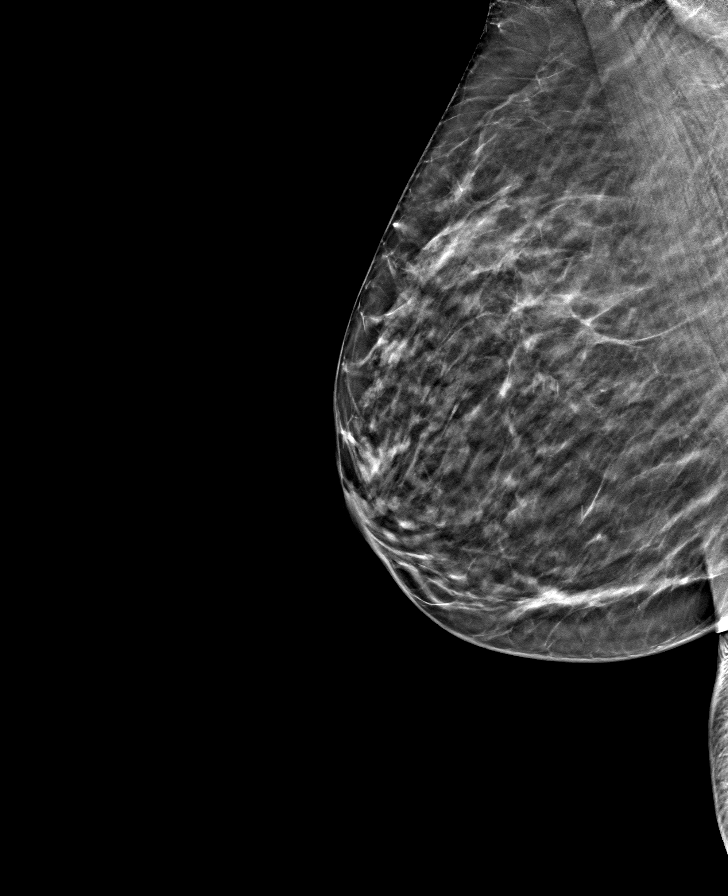

[L CC tomo · tomo slice 25/50.0]
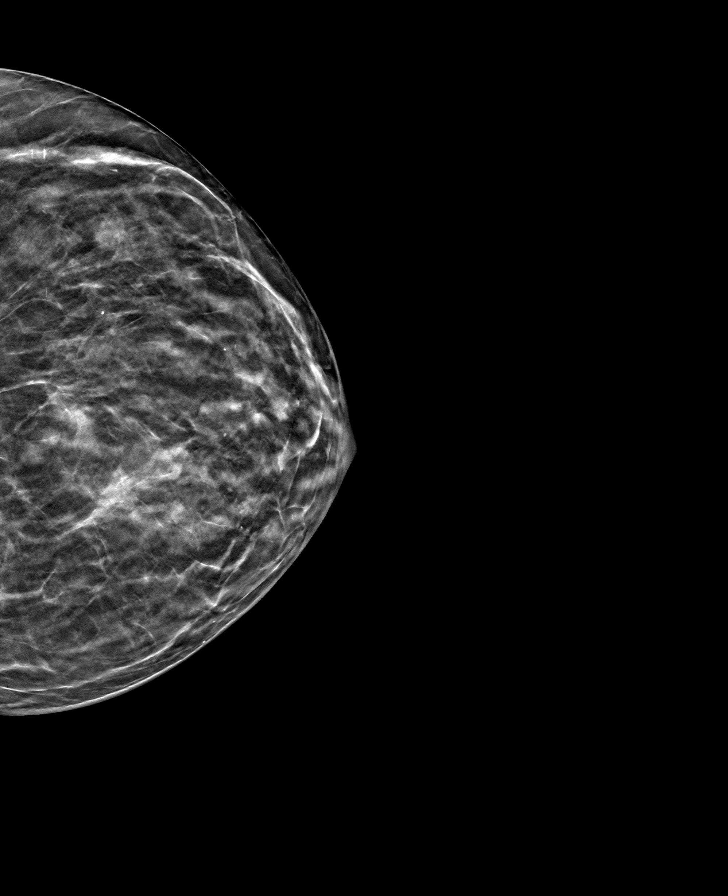

[L MLO tomo · tomo slice 28/55.0]
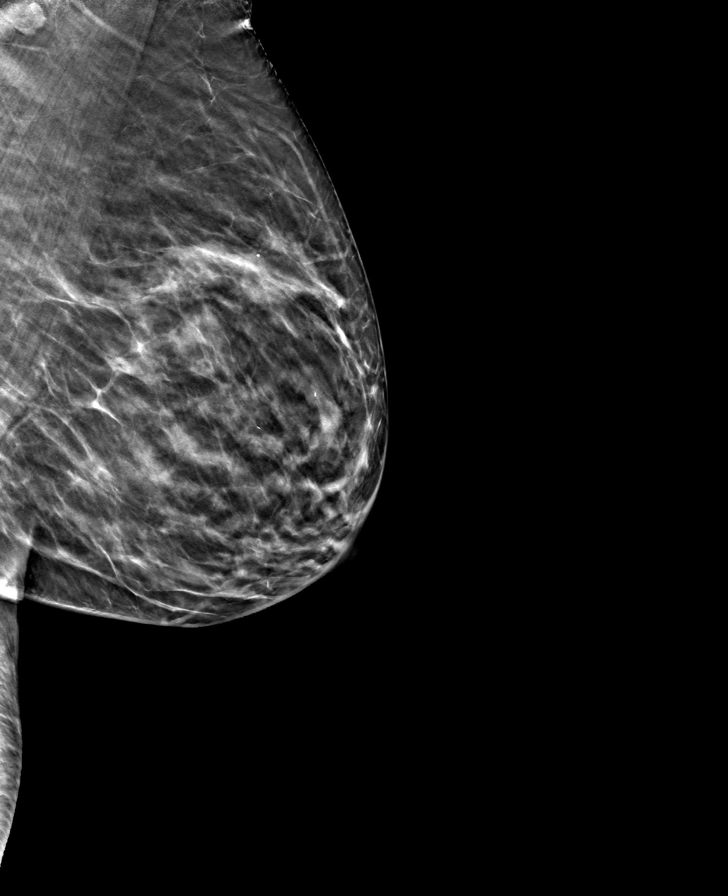

[R CC tomo · tomo slice 27/52.0]
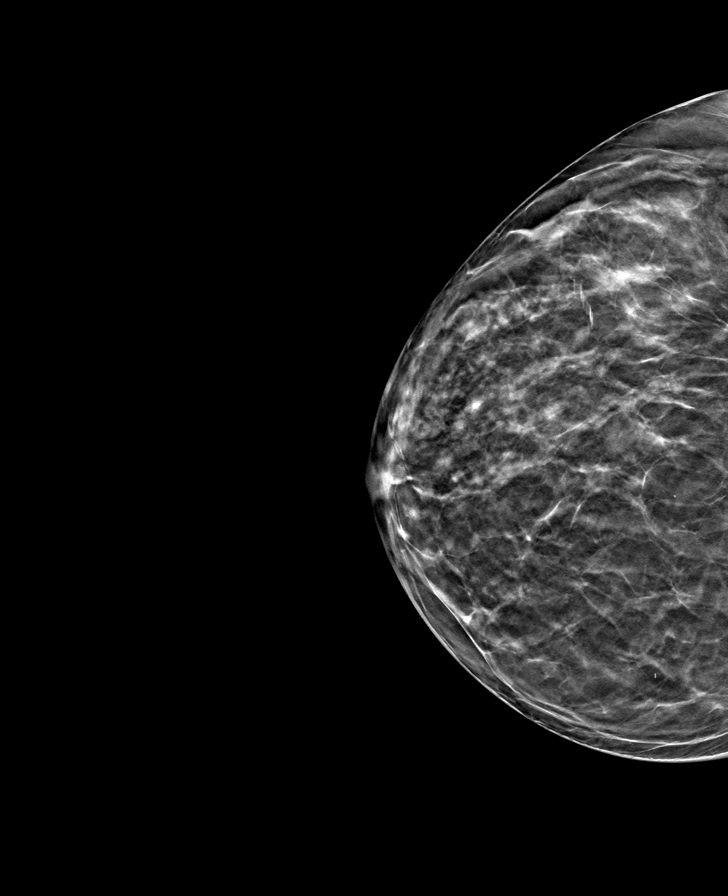

[8 of 24 positions shown; findings below may reference images not displayed]

ACR Breast Density Category c: The breast tissue is heterogeneously
dense, which may obscure small masses.
FINDINGS: There are no findings suspicious for malignancy.
IMPRESSION: No mammographic evidence of malignancy. A result letter of this
screening mammogram will be mailed directly to the patient.

RECOMMENDATION:
Screening mammogram in one year. (Code:[id number])

BI-RADS CATEGORY  1: Negative.

## 2022-07-27 DIAGNOSIS — M3320 Polymyositis, organ involvement unspecified: Secondary | ICD-10-CM | POA: Diagnosis not present

## 2022-07-27 DIAGNOSIS — Z79899 Other long term (current) drug therapy: Secondary | ICD-10-CM | POA: Diagnosis not present

## 2022-07-27 DIAGNOSIS — M797 Fibromyalgia: Secondary | ICD-10-CM | POA: Diagnosis not present

## 2022-11-23 ENCOUNTER — Other Ambulatory Visit: Payer: Self-pay | Admitting: Internal Medicine

## 2022-11-23 DIAGNOSIS — M810 Age-related osteoporosis without current pathological fracture: Secondary | ICD-10-CM

## 2022-12-31 ENCOUNTER — Other Ambulatory Visit: Payer: Self-pay | Admitting: Internal Medicine

## 2022-12-31 DIAGNOSIS — Z0000 Encounter for general adult medical examination without abnormal findings: Secondary | ICD-10-CM

## 2023-01-25 DIAGNOSIS — Z79899 Other long term (current) drug therapy: Secondary | ICD-10-CM | POA: Diagnosis not present

## 2023-01-25 DIAGNOSIS — R531 Weakness: Secondary | ICD-10-CM | POA: Diagnosis not present

## 2023-01-25 DIAGNOSIS — M3320 Polymyositis, organ involvement unspecified: Secondary | ICD-10-CM | POA: Diagnosis not present

## 2023-01-25 DIAGNOSIS — M79605 Pain in left leg: Secondary | ICD-10-CM | POA: Diagnosis not present

## 2023-01-25 DIAGNOSIS — M1991 Primary osteoarthritis, unspecified site: Secondary | ICD-10-CM | POA: Diagnosis not present

## 2023-01-25 DIAGNOSIS — M79604 Pain in right leg: Secondary | ICD-10-CM | POA: Diagnosis not present

## 2023-01-25 DIAGNOSIS — Z681 Body mass index (BMI) 19 or less, adult: Secondary | ICD-10-CM | POA: Diagnosis not present

## 2023-01-25 DIAGNOSIS — M797 Fibromyalgia: Secondary | ICD-10-CM | POA: Diagnosis not present

## 2023-04-06 DIAGNOSIS — Z681 Body mass index (BMI) 19 or less, adult: Secondary | ICD-10-CM | POA: Diagnosis not present

## 2023-04-06 DIAGNOSIS — G959 Disease of spinal cord, unspecified: Secondary | ICD-10-CM | POA: Diagnosis not present

## 2023-04-20 DIAGNOSIS — M4802 Spinal stenosis, cervical region: Secondary | ICD-10-CM | POA: Diagnosis not present

## 2023-04-20 DIAGNOSIS — M4803 Spinal stenosis, cervicothoracic region: Secondary | ICD-10-CM | POA: Diagnosis not present

## 2023-04-20 DIAGNOSIS — M542 Cervicalgia: Secondary | ICD-10-CM | POA: Diagnosis not present

## 2023-04-20 DIAGNOSIS — M47812 Spondylosis without myelopathy or radiculopathy, cervical region: Secondary | ICD-10-CM | POA: Diagnosis not present

## 2023-04-20 DIAGNOSIS — G959 Disease of spinal cord, unspecified: Secondary | ICD-10-CM | POA: Diagnosis not present

## 2023-04-28 DIAGNOSIS — R531 Weakness: Secondary | ICD-10-CM | POA: Diagnosis not present

## 2023-04-28 DIAGNOSIS — M797 Fibromyalgia: Secondary | ICD-10-CM | POA: Diagnosis not present

## 2023-04-28 DIAGNOSIS — M1991 Primary osteoarthritis, unspecified site: Secondary | ICD-10-CM | POA: Diagnosis not present

## 2023-04-28 DIAGNOSIS — Z681 Body mass index (BMI) 19 or less, adult: Secondary | ICD-10-CM | POA: Diagnosis not present

## 2023-04-28 DIAGNOSIS — Z79899 Other long term (current) drug therapy: Secondary | ICD-10-CM | POA: Diagnosis not present

## 2023-04-28 DIAGNOSIS — M3320 Polymyositis, organ involvement unspecified: Secondary | ICD-10-CM | POA: Diagnosis not present

## 2023-05-13 DIAGNOSIS — Z681 Body mass index (BMI) 19 or less, adult: Secondary | ICD-10-CM | POA: Diagnosis not present

## 2023-05-13 DIAGNOSIS — G959 Disease of spinal cord, unspecified: Secondary | ICD-10-CM | POA: Diagnosis not present

## 2023-05-24 DIAGNOSIS — M4802 Spinal stenosis, cervical region: Secondary | ICD-10-CM | POA: Diagnosis not present

## 2023-05-24 DIAGNOSIS — M47812 Spondylosis without myelopathy or radiculopathy, cervical region: Secondary | ICD-10-CM | POA: Diagnosis not present

## 2023-05-24 DIAGNOSIS — H548 Legal blindness, as defined in USA: Secondary | ICD-10-CM | POA: Diagnosis not present

## 2023-05-24 DIAGNOSIS — M3320 Polymyositis, organ involvement unspecified: Secondary | ICD-10-CM | POA: Diagnosis not present

## 2023-05-24 DIAGNOSIS — G8929 Other chronic pain: Secondary | ICD-10-CM | POA: Diagnosis not present

## 2023-05-24 DIAGNOSIS — Z9181 History of falling: Secondary | ICD-10-CM | POA: Diagnosis not present

## 2023-05-24 DIAGNOSIS — Z7901 Long term (current) use of anticoagulants: Secondary | ICD-10-CM | POA: Diagnosis not present

## 2023-05-24 DIAGNOSIS — M797 Fibromyalgia: Secondary | ICD-10-CM | POA: Diagnosis not present

## 2023-05-24 DIAGNOSIS — E119 Type 2 diabetes mellitus without complications: Secondary | ICD-10-CM | POA: Diagnosis not present

## 2023-05-24 DIAGNOSIS — Z794 Long term (current) use of insulin: Secondary | ICD-10-CM | POA: Diagnosis not present

## 2023-05-24 DIAGNOSIS — M51369 Other intervertebral disc degeneration, lumbar region without mention of lumbar back pain or lower extremity pain: Secondary | ICD-10-CM | POA: Diagnosis not present

## 2023-05-26 DIAGNOSIS — M51369 Other intervertebral disc degeneration, lumbar region without mention of lumbar back pain or lower extremity pain: Secondary | ICD-10-CM | POA: Diagnosis not present

## 2023-05-26 DIAGNOSIS — G8929 Other chronic pain: Secondary | ICD-10-CM | POA: Diagnosis not present

## 2023-05-26 DIAGNOSIS — M797 Fibromyalgia: Secondary | ICD-10-CM | POA: Diagnosis not present

## 2023-05-26 DIAGNOSIS — M47812 Spondylosis without myelopathy or radiculopathy, cervical region: Secondary | ICD-10-CM | POA: Diagnosis not present

## 2023-05-26 DIAGNOSIS — E119 Type 2 diabetes mellitus without complications: Secondary | ICD-10-CM | POA: Diagnosis not present

## 2023-05-26 DIAGNOSIS — Z794 Long term (current) use of insulin: Secondary | ICD-10-CM | POA: Diagnosis not present

## 2023-05-26 DIAGNOSIS — M3320 Polymyositis, organ involvement unspecified: Secondary | ICD-10-CM | POA: Diagnosis not present

## 2023-05-26 DIAGNOSIS — H548 Legal blindness, as defined in USA: Secondary | ICD-10-CM | POA: Diagnosis not present

## 2023-05-26 DIAGNOSIS — M4802 Spinal stenosis, cervical region: Secondary | ICD-10-CM | POA: Diagnosis not present

## 2023-05-26 DIAGNOSIS — Z9181 History of falling: Secondary | ICD-10-CM | POA: Diagnosis not present

## 2023-05-26 DIAGNOSIS — Z7901 Long term (current) use of anticoagulants: Secondary | ICD-10-CM | POA: Diagnosis not present

## 2023-05-31 DIAGNOSIS — E119 Type 2 diabetes mellitus without complications: Secondary | ICD-10-CM | POA: Diagnosis not present

## 2023-05-31 DIAGNOSIS — Z794 Long term (current) use of insulin: Secondary | ICD-10-CM | POA: Diagnosis not present

## 2023-05-31 DIAGNOSIS — M51369 Other intervertebral disc degeneration, lumbar region without mention of lumbar back pain or lower extremity pain: Secondary | ICD-10-CM | POA: Diagnosis not present

## 2023-05-31 DIAGNOSIS — G8929 Other chronic pain: Secondary | ICD-10-CM | POA: Diagnosis not present

## 2023-05-31 DIAGNOSIS — M797 Fibromyalgia: Secondary | ICD-10-CM | POA: Diagnosis not present

## 2023-05-31 DIAGNOSIS — Z7901 Long term (current) use of anticoagulants: Secondary | ICD-10-CM | POA: Diagnosis not present

## 2023-05-31 DIAGNOSIS — M4802 Spinal stenosis, cervical region: Secondary | ICD-10-CM | POA: Diagnosis not present

## 2023-05-31 DIAGNOSIS — M47812 Spondylosis without myelopathy or radiculopathy, cervical region: Secondary | ICD-10-CM | POA: Diagnosis not present

## 2023-05-31 DIAGNOSIS — Z9181 History of falling: Secondary | ICD-10-CM | POA: Diagnosis not present

## 2023-05-31 DIAGNOSIS — M3320 Polymyositis, organ involvement unspecified: Secondary | ICD-10-CM | POA: Diagnosis not present

## 2023-05-31 DIAGNOSIS — H548 Legal blindness, as defined in USA: Secondary | ICD-10-CM | POA: Diagnosis not present

## 2023-06-02 DIAGNOSIS — Z9181 History of falling: Secondary | ICD-10-CM | POA: Diagnosis not present

## 2023-06-02 DIAGNOSIS — M4802 Spinal stenosis, cervical region: Secondary | ICD-10-CM | POA: Diagnosis not present

## 2023-06-02 DIAGNOSIS — G8929 Other chronic pain: Secondary | ICD-10-CM | POA: Diagnosis not present

## 2023-06-02 DIAGNOSIS — H548 Legal blindness, as defined in USA: Secondary | ICD-10-CM | POA: Diagnosis not present

## 2023-06-02 DIAGNOSIS — M3320 Polymyositis, organ involvement unspecified: Secondary | ICD-10-CM | POA: Diagnosis not present

## 2023-06-02 DIAGNOSIS — M797 Fibromyalgia: Secondary | ICD-10-CM | POA: Diagnosis not present

## 2023-06-02 DIAGNOSIS — M51369 Other intervertebral disc degeneration, lumbar region without mention of lumbar back pain or lower extremity pain: Secondary | ICD-10-CM | POA: Diagnosis not present

## 2023-06-02 DIAGNOSIS — Z7901 Long term (current) use of anticoagulants: Secondary | ICD-10-CM | POA: Diagnosis not present

## 2023-06-02 DIAGNOSIS — Z794 Long term (current) use of insulin: Secondary | ICD-10-CM | POA: Diagnosis not present

## 2023-06-02 DIAGNOSIS — E119 Type 2 diabetes mellitus without complications: Secondary | ICD-10-CM | POA: Diagnosis not present

## 2023-06-02 DIAGNOSIS — M47812 Spondylosis without myelopathy or radiculopathy, cervical region: Secondary | ICD-10-CM | POA: Diagnosis not present

## 2023-06-06 DIAGNOSIS — E7800 Pure hypercholesterolemia, unspecified: Secondary | ICD-10-CM | POA: Diagnosis not present

## 2023-06-07 DIAGNOSIS — E119 Type 2 diabetes mellitus without complications: Secondary | ICD-10-CM | POA: Diagnosis not present

## 2023-06-07 DIAGNOSIS — M797 Fibromyalgia: Secondary | ICD-10-CM | POA: Diagnosis not present

## 2023-06-07 DIAGNOSIS — H548 Legal blindness, as defined in USA: Secondary | ICD-10-CM | POA: Diagnosis not present

## 2023-06-07 DIAGNOSIS — M3320 Polymyositis, organ involvement unspecified: Secondary | ICD-10-CM | POA: Diagnosis not present

## 2023-06-07 DIAGNOSIS — M4802 Spinal stenosis, cervical region: Secondary | ICD-10-CM | POA: Diagnosis not present

## 2023-06-07 DIAGNOSIS — M47812 Spondylosis without myelopathy or radiculopathy, cervical region: Secondary | ICD-10-CM | POA: Diagnosis not present

## 2023-06-07 DIAGNOSIS — M51369 Other intervertebral disc degeneration, lumbar region without mention of lumbar back pain or lower extremity pain: Secondary | ICD-10-CM | POA: Diagnosis not present

## 2023-06-07 DIAGNOSIS — Z9181 History of falling: Secondary | ICD-10-CM | POA: Diagnosis not present

## 2023-06-07 DIAGNOSIS — Z794 Long term (current) use of insulin: Secondary | ICD-10-CM | POA: Diagnosis not present

## 2023-06-07 DIAGNOSIS — G8929 Other chronic pain: Secondary | ICD-10-CM | POA: Diagnosis not present

## 2023-06-07 DIAGNOSIS — Z7901 Long term (current) use of anticoagulants: Secondary | ICD-10-CM | POA: Diagnosis not present

## 2023-06-09 DIAGNOSIS — M797 Fibromyalgia: Secondary | ICD-10-CM | POA: Diagnosis not present

## 2023-06-09 DIAGNOSIS — Z7901 Long term (current) use of anticoagulants: Secondary | ICD-10-CM | POA: Diagnosis not present

## 2023-06-09 DIAGNOSIS — Z794 Long term (current) use of insulin: Secondary | ICD-10-CM | POA: Diagnosis not present

## 2023-06-09 DIAGNOSIS — G8929 Other chronic pain: Secondary | ICD-10-CM | POA: Diagnosis not present

## 2023-06-09 DIAGNOSIS — M3320 Polymyositis, organ involvement unspecified: Secondary | ICD-10-CM | POA: Diagnosis not present

## 2023-06-09 DIAGNOSIS — E119 Type 2 diabetes mellitus without complications: Secondary | ICD-10-CM | POA: Diagnosis not present

## 2023-06-09 DIAGNOSIS — M51369 Other intervertebral disc degeneration, lumbar region without mention of lumbar back pain or lower extremity pain: Secondary | ICD-10-CM | POA: Diagnosis not present

## 2023-06-09 DIAGNOSIS — H548 Legal blindness, as defined in USA: Secondary | ICD-10-CM | POA: Diagnosis not present

## 2023-06-09 DIAGNOSIS — M4802 Spinal stenosis, cervical region: Secondary | ICD-10-CM | POA: Diagnosis not present

## 2023-06-09 DIAGNOSIS — Z9181 History of falling: Secondary | ICD-10-CM | POA: Diagnosis not present

## 2023-06-09 DIAGNOSIS — M47812 Spondylosis without myelopathy or radiculopathy, cervical region: Secondary | ICD-10-CM | POA: Diagnosis not present

## 2023-06-14 DIAGNOSIS — M51369 Other intervertebral disc degeneration, lumbar region without mention of lumbar back pain or lower extremity pain: Secondary | ICD-10-CM | POA: Diagnosis not present

## 2023-06-14 DIAGNOSIS — Z7901 Long term (current) use of anticoagulants: Secondary | ICD-10-CM | POA: Diagnosis not present

## 2023-06-14 DIAGNOSIS — M3320 Polymyositis, organ involvement unspecified: Secondary | ICD-10-CM | POA: Diagnosis not present

## 2023-06-14 DIAGNOSIS — M797 Fibromyalgia: Secondary | ICD-10-CM | POA: Diagnosis not present

## 2023-06-14 DIAGNOSIS — H35373 Puckering of macula, bilateral: Secondary | ICD-10-CM | POA: Diagnosis not present

## 2023-06-14 DIAGNOSIS — H26491 Other secondary cataract, right eye: Secondary | ICD-10-CM | POA: Diagnosis not present

## 2023-06-14 DIAGNOSIS — G8929 Other chronic pain: Secondary | ICD-10-CM | POA: Diagnosis not present

## 2023-06-14 DIAGNOSIS — E119 Type 2 diabetes mellitus without complications: Secondary | ICD-10-CM | POA: Diagnosis not present

## 2023-06-14 DIAGNOSIS — H04123 Dry eye syndrome of bilateral lacrimal glands: Secondary | ICD-10-CM | POA: Diagnosis not present

## 2023-06-14 DIAGNOSIS — Z9181 History of falling: Secondary | ICD-10-CM | POA: Diagnosis not present

## 2023-06-14 DIAGNOSIS — Z961 Presence of intraocular lens: Secondary | ICD-10-CM | POA: Diagnosis not present

## 2023-06-14 DIAGNOSIS — H52223 Regular astigmatism, bilateral: Secondary | ICD-10-CM | POA: Diagnosis not present

## 2023-06-14 DIAGNOSIS — M47812 Spondylosis without myelopathy or radiculopathy, cervical region: Secondary | ICD-10-CM | POA: Diagnosis not present

## 2023-06-14 DIAGNOSIS — H43813 Vitreous degeneration, bilateral: Secondary | ICD-10-CM | POA: Diagnosis not present

## 2023-06-14 DIAGNOSIS — M4802 Spinal stenosis, cervical region: Secondary | ICD-10-CM | POA: Diagnosis not present

## 2023-06-14 DIAGNOSIS — Z794 Long term (current) use of insulin: Secondary | ICD-10-CM | POA: Diagnosis not present

## 2023-06-14 DIAGNOSIS — E113513 Type 2 diabetes mellitus with proliferative diabetic retinopathy with macular edema, bilateral: Secondary | ICD-10-CM | POA: Diagnosis not present

## 2023-06-14 DIAGNOSIS — H524 Presbyopia: Secondary | ICD-10-CM | POA: Diagnosis not present

## 2023-06-14 DIAGNOSIS — H548 Legal blindness, as defined in USA: Secondary | ICD-10-CM | POA: Diagnosis not present

## 2023-06-16 DIAGNOSIS — G8929 Other chronic pain: Secondary | ICD-10-CM | POA: Diagnosis not present

## 2023-06-16 DIAGNOSIS — Z794 Long term (current) use of insulin: Secondary | ICD-10-CM | POA: Diagnosis not present

## 2023-06-16 DIAGNOSIS — M51369 Other intervertebral disc degeneration, lumbar region without mention of lumbar back pain or lower extremity pain: Secondary | ICD-10-CM | POA: Diagnosis not present

## 2023-06-16 DIAGNOSIS — E119 Type 2 diabetes mellitus without complications: Secondary | ICD-10-CM | POA: Diagnosis not present

## 2023-06-16 DIAGNOSIS — Z7901 Long term (current) use of anticoagulants: Secondary | ICD-10-CM | POA: Diagnosis not present

## 2023-06-16 DIAGNOSIS — Z9181 History of falling: Secondary | ICD-10-CM | POA: Diagnosis not present

## 2023-06-16 DIAGNOSIS — M797 Fibromyalgia: Secondary | ICD-10-CM | POA: Diagnosis not present

## 2023-06-16 DIAGNOSIS — H548 Legal blindness, as defined in USA: Secondary | ICD-10-CM | POA: Diagnosis not present

## 2023-06-16 DIAGNOSIS — M47812 Spondylosis without myelopathy or radiculopathy, cervical region: Secondary | ICD-10-CM | POA: Diagnosis not present

## 2023-06-16 DIAGNOSIS — M3320 Polymyositis, organ involvement unspecified: Secondary | ICD-10-CM | POA: Diagnosis not present

## 2023-06-16 DIAGNOSIS — M4802 Spinal stenosis, cervical region: Secondary | ICD-10-CM | POA: Diagnosis not present

## 2023-06-21 DIAGNOSIS — I6523 Occlusion and stenosis of bilateral carotid arteries: Secondary | ICD-10-CM | POA: Diagnosis not present

## 2023-06-21 DIAGNOSIS — E785 Hyperlipidemia, unspecified: Secondary | ICD-10-CM | POA: Diagnosis not present

## 2023-06-21 DIAGNOSIS — Z789 Other specified health status: Secondary | ICD-10-CM | POA: Diagnosis not present

## 2023-06-21 DIAGNOSIS — E119 Type 2 diabetes mellitus without complications: Secondary | ICD-10-CM | POA: Diagnosis not present

## 2023-06-21 DIAGNOSIS — Z7901 Long term (current) use of anticoagulants: Secondary | ICD-10-CM | POA: Diagnosis not present

## 2023-07-15 DIAGNOSIS — M3320 Polymyositis, organ involvement unspecified: Secondary | ICD-10-CM | POA: Diagnosis not present

## 2023-07-15 DIAGNOSIS — Z9181 History of falling: Secondary | ICD-10-CM | POA: Diagnosis not present

## 2023-07-15 DIAGNOSIS — M51369 Other intervertebral disc degeneration, lumbar region without mention of lumbar back pain or lower extremity pain: Secondary | ICD-10-CM | POA: Diagnosis not present

## 2023-07-15 DIAGNOSIS — M4802 Spinal stenosis, cervical region: Secondary | ICD-10-CM | POA: Diagnosis not present

## 2023-07-15 DIAGNOSIS — Z7901 Long term (current) use of anticoagulants: Secondary | ICD-10-CM | POA: Diagnosis not present

## 2023-07-15 DIAGNOSIS — E119 Type 2 diabetes mellitus without complications: Secondary | ICD-10-CM | POA: Diagnosis not present

## 2023-07-15 DIAGNOSIS — Z794 Long term (current) use of insulin: Secondary | ICD-10-CM | POA: Diagnosis not present

## 2023-07-15 DIAGNOSIS — H548 Legal blindness, as defined in USA: Secondary | ICD-10-CM | POA: Diagnosis not present

## 2023-07-15 DIAGNOSIS — M797 Fibromyalgia: Secondary | ICD-10-CM | POA: Diagnosis not present

## 2023-07-15 DIAGNOSIS — G8929 Other chronic pain: Secondary | ICD-10-CM | POA: Diagnosis not present

## 2023-07-15 DIAGNOSIS — M47812 Spondylosis without myelopathy or radiculopathy, cervical region: Secondary | ICD-10-CM | POA: Diagnosis not present

## 2023-07-27 ENCOUNTER — Ambulatory Visit
Admission: RE | Admit: 2023-07-27 | Discharge: 2023-07-27 | Disposition: A | Discharge status: Home / Self Care | Payer: 59 | Source: Ambulatory Visit | Attending: Internal Medicine | Admitting: Internal Medicine

## 2023-07-27 DIAGNOSIS — Z1231 Encounter for screening mammogram for malignant neoplasm of breast: Secondary | ICD-10-CM | POA: Diagnosis not present

## 2023-07-27 DIAGNOSIS — M8588 Other specified disorders of bone density and structure, other site: Secondary | ICD-10-CM | POA: Diagnosis not present

## 2023-07-27 DIAGNOSIS — Z0000 Encounter for general adult medical examination without abnormal findings: Secondary | ICD-10-CM

## 2023-07-27 DIAGNOSIS — M810 Age-related osteoporosis without current pathological fracture: Secondary | ICD-10-CM

## 2023-08-12 DIAGNOSIS — Z79899 Other long term (current) drug therapy: Secondary | ICD-10-CM | POA: Diagnosis not present

## 2023-08-12 DIAGNOSIS — M1991 Primary osteoarthritis, unspecified site: Secondary | ICD-10-CM | POA: Diagnosis not present

## 2023-08-12 DIAGNOSIS — M3320 Polymyositis, organ involvement unspecified: Secondary | ICD-10-CM | POA: Diagnosis not present

## 2023-08-12 DIAGNOSIS — M797 Fibromyalgia: Secondary | ICD-10-CM | POA: Diagnosis not present

## 2023-08-12 DIAGNOSIS — Z681 Body mass index (BMI) 19 or less, adult: Secondary | ICD-10-CM | POA: Diagnosis not present

## 2023-09-08 DIAGNOSIS — E113513 Type 2 diabetes mellitus with proliferative diabetic retinopathy with macular edema, bilateral: Secondary | ICD-10-CM | POA: Diagnosis not present

## 2023-09-08 DIAGNOSIS — Z794 Long term (current) use of insulin: Secondary | ICD-10-CM | POA: Diagnosis not present

## 2023-09-08 DIAGNOSIS — H04123 Dry eye syndrome of bilateral lacrimal glands: Secondary | ICD-10-CM | POA: Diagnosis not present

## 2023-09-08 DIAGNOSIS — H43813 Vitreous degeneration, bilateral: Secondary | ICD-10-CM | POA: Diagnosis not present

## 2023-09-08 DIAGNOSIS — H35373 Puckering of macula, bilateral: Secondary | ICD-10-CM | POA: Diagnosis not present

## 2023-09-08 DIAGNOSIS — H26491 Other secondary cataract, right eye: Secondary | ICD-10-CM | POA: Diagnosis not present

## 2023-09-08 DIAGNOSIS — H52223 Regular astigmatism, bilateral: Secondary | ICD-10-CM | POA: Diagnosis not present

## 2023-09-08 DIAGNOSIS — Z961 Presence of intraocular lens: Secondary | ICD-10-CM | POA: Diagnosis not present

## 2023-09-23 DIAGNOSIS — Z794 Long term (current) use of insulin: Secondary | ICD-10-CM | POA: Diagnosis not present

## 2023-09-23 DIAGNOSIS — M79604 Pain in right leg: Secondary | ICD-10-CM | POA: Diagnosis not present

## 2023-09-23 DIAGNOSIS — Z7901 Long term (current) use of anticoagulants: Secondary | ICD-10-CM | POA: Diagnosis not present

## 2023-09-23 DIAGNOSIS — M25551 Pain in right hip: Secondary | ICD-10-CM | POA: Diagnosis not present

## 2023-09-23 DIAGNOSIS — Z79899 Other long term (current) drug therapy: Secondary | ICD-10-CM | POA: Diagnosis not present

## 2023-09-23 DIAGNOSIS — Z743 Need for continuous supervision: Secondary | ICD-10-CM | POA: Diagnosis not present

## 2023-09-23 DIAGNOSIS — R739 Hyperglycemia, unspecified: Secondary | ICD-10-CM | POA: Diagnosis not present

## 2023-09-25 DIAGNOSIS — Z136 Encounter for screening for cardiovascular disorders: Secondary | ICD-10-CM | POA: Diagnosis not present

## 2023-11-11 DIAGNOSIS — Z79899 Other long term (current) drug therapy: Secondary | ICD-10-CM | POA: Diagnosis not present

## 2023-11-11 DIAGNOSIS — M1991 Primary osteoarthritis, unspecified site: Secondary | ICD-10-CM | POA: Diagnosis not present

## 2023-11-11 DIAGNOSIS — Z681 Body mass index (BMI) 19 or less, adult: Secondary | ICD-10-CM | POA: Diagnosis not present

## 2023-11-11 DIAGNOSIS — M3320 Polymyositis, organ involvement unspecified: Secondary | ICD-10-CM | POA: Diagnosis not present

## 2023-11-11 DIAGNOSIS — M797 Fibromyalgia: Secondary | ICD-10-CM | POA: Diagnosis not present

## 2023-11-11 DIAGNOSIS — R634 Abnormal weight loss: Secondary | ICD-10-CM | POA: Diagnosis not present

## 2024-02-10 DIAGNOSIS — M797 Fibromyalgia: Secondary | ICD-10-CM | POA: Diagnosis not present

## 2024-02-10 DIAGNOSIS — Z79899 Other long term (current) drug therapy: Secondary | ICD-10-CM | POA: Diagnosis not present

## 2024-02-10 DIAGNOSIS — M1991 Primary osteoarthritis, unspecified site: Secondary | ICD-10-CM | POA: Diagnosis not present

## 2024-02-10 DIAGNOSIS — Z681 Body mass index (BMI) 19 or less, adult: Secondary | ICD-10-CM | POA: Diagnosis not present

## 2024-02-10 DIAGNOSIS — M3320 Polymyositis, organ involvement unspecified: Secondary | ICD-10-CM | POA: Diagnosis not present

## 2024-06-11 NOTE — Progress Notes (Signed)
 Dawn Sanchez                                          MRN: [id number]   06/11/2024   The [facility] Quality Team Specialist reviewed this patient medical record for the purposes of chart review for care gap closure. The following were reviewed: chart review for care gap closure-glycemic status assessment and kidney health evaluation for diabetes:eGFR  and uACR.    [facility] Quality Team
